# Patient Record
Sex: Male | Born: 1940
Health system: Southern US, Community
[De-identification: ages and names within clinical notes are randomized; demographics above are authoritative.]

## PROBLEM LIST (undated history)

## (undated) DIAGNOSIS — G4709 Other insomnia: Secondary | ICD-10-CM

## (undated) DIAGNOSIS — K219 Gastro-esophageal reflux disease without esophagitis: Secondary | ICD-10-CM

## (undated) DIAGNOSIS — I10 Essential (primary) hypertension: Secondary | ICD-10-CM

## (undated) DIAGNOSIS — I639 Cerebral infarction, unspecified: Secondary | ICD-10-CM

## (undated) DIAGNOSIS — K429 Umbilical hernia without obstruction or gangrene: Secondary | ICD-10-CM

## (undated) DIAGNOSIS — K5900 Constipation, unspecified: Secondary | ICD-10-CM

## (undated) DIAGNOSIS — E119 Type 2 diabetes mellitus without complications: Secondary | ICD-10-CM

## (undated) DIAGNOSIS — E785 Hyperlipidemia, unspecified: Secondary | ICD-10-CM

## (undated) HISTORY — DX: Gastro-esophageal reflux disease without esophagitis: K21.9

## (undated) HISTORY — DX: Hyperlipidemia, unspecified: E78.5

## (undated) HISTORY — DX: Other insomnia: G47.09

## (undated) HISTORY — DX: Essential (primary) hypertension: I10

## (undated) HISTORY — PX: HERNIA REPAIR: SHX51

## (undated) HISTORY — DX: Umbilical hernia without obstruction or gangrene: K42.9

---

## 2010-04-27 ENCOUNTER — Emergency Department (HOSPITAL_COMMUNITY): Admission: EM | Admit: 2010-04-27 | Discharge: 2010-04-27 | Payer: Self-pay | Admitting: Family Medicine

## 2011-10-03 ENCOUNTER — Encounter (INDEPENDENT_AMBULATORY_CARE_PROVIDER_SITE_OTHER): Payer: Self-pay | Admitting: General Surgery

## 2011-10-12 ENCOUNTER — Ambulatory Visit (INDEPENDENT_AMBULATORY_CARE_PROVIDER_SITE_OTHER): Payer: Medicaid Other | Admitting: General Surgery

## 2011-10-12 ENCOUNTER — Encounter (INDEPENDENT_AMBULATORY_CARE_PROVIDER_SITE_OTHER): Payer: Self-pay | Admitting: General Surgery

## 2011-10-12 DIAGNOSIS — K429 Umbilical hernia without obstruction or gangrene: Secondary | ICD-10-CM | POA: Insufficient documentation

## 2011-10-12 DIAGNOSIS — K4090 Unilateral inguinal hernia, without obstruction or gangrene, not specified as recurrent: Secondary | ICD-10-CM | POA: Insufficient documentation

## 2011-10-12 NOTE — Progress Notes (Signed)
Patient ID: Daniel Porter, male   DOB: 07-19-1941, 71 y.o.   MRN: [id number]  Chief Complaint  Patient presents with  . Umbilical Hernia    HPI Daniel Porter is a 71 y.o. male.  [name] is from [place].  He has been here for approximately 3 years. He has a many year history of an umbilical hernia. For a long time it did not bother him. More recently he has some pain in that area. He occasionally has hiccups but no other significant GI history. In the past, he has also had bilateral inguinal hernia repairs. The right side recurred but this does not bother him at all. It has not changed for many years. He is not interested in having anything done for that at this time. He is concerned about his umbilical hernia.  Past Medical History  Diagnosis Date  . GERD (gastroesophageal reflux disease)   . Hyperlipidemia   . Hypertension   . Umbilical hernia   . Initial insomnia     History reviewed. No pertinent past surgical history.  History reviewed. No pertinent family history.  Social History History  Substance Use Topics  . Smoking status: Never Smoker   . Smokeless tobacco: Never Used  . Alcohol Use: No    No Known Allergies  Current Outpatient Prescriptions  Medication Sig Dispense Refill  . esomeprazole (NEXIUM) 40 MG capsule Take 40 mg by mouth daily before breakfast.      . lisinopril (PRINIVIL,ZESTRIL) 20 MG tablet Take 20 mg by mouth daily.      . celecoxib (CELEBREX) 200 MG capsule Take 200 mg by mouth daily.      Marland Kitchen gemfibrozil (LOPID) 600 MG tablet Take 600 mg by mouth 2 (two) times daily before a meal.      . LORazepam (ATIVAN) 2 MG tablet Take 2 mg by mouth daily as needed.        Review of Systems Review of Systems  Constitutional: Negative.   HENT: Negative.   Eyes: Negative.   Respiratory: Negative.   Cardiovascular: Negative.   Gastrointestinal:       See history of present illness  Genitourinary:       See history of present illness    Musculoskeletal: Negative.   Neurological: Negative.     Blood pressure 132/76, pulse 70, temperature 97.8 F (36.6 C), temperature source Temporal, resp. rate 18, height 5\' 8"  (1.727 m), weight 205 lb (92.987 kg).  Physical Exam Physical Exam  HENT:  Head: Normocephalic and atraumatic.  Eyes: EOM are normal. Pupils are equal, round, and reactive to light.  Neck: Normal range of motion.  Cardiovascular: Normal rate, regular rhythm and normal heart sounds.   Pulmonary/Chest: Effort normal and breath sounds normal. No respiratory distress. He has no wheezes.  Abdominal: Soft. He exhibits no distension. There is no tenderness. There is no rebound.       Large Umbilical hernia partially reduces. It is not tender, mostly lying superior to the umbilicus    Data Reviewed Notes from Dr. Melven Porter  Assessment    Symptomatic umbilical hernia, asymptomatic right inguinal hernia    Plan    I have offered repair of his umbilical hernia with mesh. He does not wish to have treatment for his right inguinal hernia this time. Umbilical hernia repair procedure, risks, and benefits were discussed in detail with the patient. His English is pretty good. He also spoke with his primary care physician who helped explain things. I gave him some  literature. I've asked him to review with his daughter whose English is excellent. He will call if he has further questions. He is agreeable with surgery.       Daniel Porter 10/12/2011, 9:50 AM

## 2011-11-08 ENCOUNTER — Encounter (HOSPITAL_COMMUNITY): Payer: Self-pay | Admitting: Pharmacy Technician

## 2011-11-16 ENCOUNTER — Other Ambulatory Visit: Payer: Self-pay

## 2011-11-16 ENCOUNTER — Encounter (HOSPITAL_COMMUNITY)
Admission: RE | Admit: 2011-11-16 | Discharge: 2011-11-16 | Disposition: A | Discharge status: Home / Self Care | Payer: Medicaid Other | Source: Ambulatory Visit | Attending: General Surgery | Admitting: General Surgery

## 2011-11-16 ENCOUNTER — Encounter (HOSPITAL_COMMUNITY): Payer: Self-pay

## 2011-11-16 LAB — CBC
HCT: 42.9 % (ref 39.0–52.0)
Hemoglobin: 15.2 g/dL (ref 13.0–17.0)
MCHC: 35.4 g/dL (ref 30.0–36.0)
RBC: 5.06 MIL/uL (ref 4.22–5.81)
WBC: 7.9 10*3/uL (ref 4.0–10.5)

## 2011-11-16 LAB — BASIC METABOLIC PANEL
BUN: 22 mg/dL (ref 6–23)
CO2: 22 mEq/L (ref 19–32)
Chloride: 101 mEq/L (ref 96–112)
Glucose, Bld: 106 mg/dL — ABNORMAL HIGH (ref 70–99)
Potassium: 4.6 mEq/L (ref 3.5–5.1)

## 2011-11-16 LAB — SURGICAL PCR SCREEN
MRSA, PCR: NEGATIVE
Staphylococcus aureus: NEGATIVE

## 2011-11-16 NOTE — Pre-Procedure Instructions (Signed)
20 Daniel Porter  11/16/2011   Your procedure is scheduled on:  11/22/11  Report to [facility] at 800 AM.  Call this number if you have problems the morning of surgery: [phone]   Remember:   Do not eat food:After Midnight.  May have clear liquids: up to 4 Hours before arrival.  Clear liquids include soda, tea, black coffee, apple or grape juice, broth.  Take these medicines the morning of surgery with A SIP OF WATER: nexium   Do not wear jewelry, make-up or nail polish.  Do not wear lotions, powders, or perfumes. You may wear deodorant.  Do not shave 48 hours prior to surgery.  Do not bring valuables to the hospital.  Contacts, dentures or bridgework may not be worn into surgery.  Leave suitcase in the car. After surgery it may be brought to your room.  For patients admitted to the hospital, checkout time is 11:00 AM the day of discharge.   Patients discharged the day of surgery will not be allowed to drive home.  Name and phone number of your driver: family  Special Instructions: CHG Shower Use Special Wash: 1/2 bottle night before surgery and 1/2 bottle morning of surgery.   Please read over the following fact sheets that you were given: Pain Booklet, MRSA Information and Surgical Site Infection Prevention

## 2011-11-21 MED ORDER — CEFAZOLIN SODIUM-DEXTROSE 2-3 GM-% IV SOLR
2.0000 g | INTRAVENOUS | Status: AC
  Administered 2011-11-22: 2 g via INTRAVENOUS

## 2011-11-22 ENCOUNTER — Encounter (HOSPITAL_COMMUNITY): Payer: Self-pay | Admitting: *Deleted

## 2011-11-22 ENCOUNTER — Ambulatory Visit (HOSPITAL_COMMUNITY)
Admission: RE | Admit: 2011-11-22 | Discharge: 2011-11-22 | Disposition: A | Discharge status: Home / Self Care | Payer: Medicaid Other | Source: Ambulatory Visit | Attending: General Surgery | Admitting: General Surgery

## 2011-11-22 ENCOUNTER — Ambulatory Visit (HOSPITAL_COMMUNITY): Payer: Medicaid Other | Admitting: *Deleted

## 2011-11-22 ENCOUNTER — Encounter (HOSPITAL_COMMUNITY)
Admission: RE | Disposition: A | Discharge status: Home / Self Care | Payer: Self-pay | Source: Ambulatory Visit | Attending: General Surgery

## 2011-11-22 DIAGNOSIS — E785 Hyperlipidemia, unspecified: Secondary | ICD-10-CM | POA: Insufficient documentation

## 2011-11-22 DIAGNOSIS — K429 Umbilical hernia without obstruction or gangrene: Secondary | ICD-10-CM

## 2011-11-22 DIAGNOSIS — Z79899 Other long term (current) drug therapy: Secondary | ICD-10-CM | POA: Insufficient documentation

## 2011-11-22 DIAGNOSIS — K219 Gastro-esophageal reflux disease without esophagitis: Secondary | ICD-10-CM | POA: Insufficient documentation

## 2011-11-22 DIAGNOSIS — Z01810 Encounter for preprocedural cardiovascular examination: Secondary | ICD-10-CM | POA: Insufficient documentation

## 2011-11-22 DIAGNOSIS — I10 Essential (primary) hypertension: Secondary | ICD-10-CM | POA: Insufficient documentation

## 2011-11-22 HISTORY — PX: UMBILICAL HERNIA REPAIR: SHX196

## 2011-11-22 SURGERY — REPAIR, HERNIA, UMBILICAL, ADULT
Anesthesia: General | Site: Abdomen | Wound class: Clean

## 2011-11-22 MED ORDER — CEFAZOLIN SODIUM 1-5 GM-% IV SOLN
INTRAVENOUS | Status: AC
Start: 2011-11-22 — End: 2011-11-22
  Filled 2011-11-22: qty 50

## 2011-11-22 MED ORDER — FENTANYL CITRATE 0.05 MG/ML IJ SOLN
INTRAMUSCULAR | Status: DC | PRN
  Administered 2011-11-22 (×2): 25 ug via INTRAVENOUS
  Administered 2011-11-22 (×2): 50 ug via INTRAVENOUS

## 2011-11-22 MED ORDER — LACTATED RINGERS IV SOLN
INTRAVENOUS | Status: DC | PRN
  Administered 2011-11-22: 09:00:00 via INTRAVENOUS

## 2011-11-22 MED ORDER — ONDANSETRON HCL 4 MG/2ML IJ SOLN: 4.0000 mg | Freq: Once | INTRAMUSCULAR | Status: DC | PRN

## 2011-11-22 MED ORDER — LACTATED RINGERS IV SOLN
INTRAVENOUS | Status: DC
  Administered 2011-11-22: 09:00:00 via INTRAVENOUS

## 2011-11-22 MED ORDER — PROPOFOL 10 MG/ML IV EMUL
INTRAVENOUS | Status: DC | PRN
  Administered 2011-11-22: 170 mg via INTRAVENOUS

## 2011-11-22 MED ORDER — VECURONIUM BROMIDE 10 MG IV SOLR
INTRAVENOUS | Status: DC | PRN
  Administered 2011-11-22: 5 mg via INTRAVENOUS

## 2011-11-22 MED ORDER — NEOSTIGMINE METHYLSULFATE 1 MG/ML IJ SOLN
INTRAMUSCULAR | Status: DC | PRN
  Administered 2011-11-22: 4 mg via INTRAVENOUS

## 2011-11-22 MED ORDER — MIDAZOLAM HCL 5 MG/5ML IJ SOLN
INTRAMUSCULAR | Status: DC | PRN
  Administered 2011-11-22: 2 mg via INTRAVENOUS

## 2011-11-22 MED ORDER — ACETAMINOPHEN 10 MG/ML IV SOLN
INTRAVENOUS | Status: DC | PRN
  Administered 2011-11-22: 1000 mg via INTRAVENOUS

## 2011-11-22 MED ORDER — HYDROMORPHONE HCL PF 1 MG/ML IJ SOLN
0.2500 mg | INTRAMUSCULAR | Status: DC | PRN
  Administered 2011-11-22 (×2): 0.5 mg via INTRAVENOUS

## 2011-11-22 MED ORDER — 0.9 % SODIUM CHLORIDE (POUR BTL) OPTIME
TOPICAL | Status: DC | PRN
  Administered 2011-11-22: 1000 mL

## 2011-11-22 MED ORDER — BUPIVACAINE-EPINEPHRINE 0.5% -1:200000 IJ SOLN
INTRAMUSCULAR | Status: DC | PRN
  Administered 2011-11-22: 20 mL

## 2011-11-22 MED ORDER — PHENYLEPHRINE HCL 10 MG/ML IJ SOLN
INTRAMUSCULAR | Status: DC | PRN
  Administered 2011-11-22: 40 ug via INTRAVENOUS

## 2011-11-22 MED ORDER — GLYCOPYRROLATE 0.2 MG/ML IJ SOLN
INTRAMUSCULAR | Status: DC | PRN
  Administered 2011-11-22: .7 mg via INTRAVENOUS

## 2011-11-22 MED ORDER — ONDANSETRON HCL 4 MG/2ML IJ SOLN
INTRAMUSCULAR | Status: DC | PRN
  Administered 2011-11-22: 4 mg via INTRAVENOUS

## 2011-11-22 MED ORDER — ACETAMINOPHEN 10 MG/ML IV SOLN
INTRAVENOUS | Status: AC
Start: 2011-11-22 — End: 2011-11-22
  Filled 2011-11-22: qty 100

## 2011-11-22 MED ORDER — OXYCODONE-ACETAMINOPHEN 5-325 MG PO TABS: 1.0000 | ORAL_TABLET | Freq: Four times a day (QID) | ORAL | Status: AC | PRN

## 2011-11-22 SURGICAL SUPPLY — 35 items
BLADE SURG ROTATE 9660 (MISCELLANEOUS) IMPLANT
CANISTER SUCTION 2500CC (MISCELLANEOUS) IMPLANT
CHLORAPREP W/TINT 26ML (MISCELLANEOUS) ×2 IMPLANT
CLOTH BEACON ORANGE TIMEOUT ST (SAFETY) ×2 IMPLANT
COVER SURGICAL LIGHT HANDLE (MISCELLANEOUS) ×2 IMPLANT
DERMABOND ADVANCED (GAUZE/BANDAGES/DRESSINGS) ×1
DERMABOND ADVANCED .7 DNX12 (GAUZE/BANDAGES/DRESSINGS) ×1 IMPLANT
DRAPE PED LAPAROTOMY (DRAPES) ×2 IMPLANT
DRAPE UTILITY 15X26 W/TAPE STR (DRAPE) ×4 IMPLANT
ELECT REM PT RETURN 9FT ADLT (ELECTROSURGICAL) ×2
ELECTRODE REM PT RTRN 9FT ADLT (ELECTROSURGICAL) ×1 IMPLANT
GLOVE BIO SURGEON STRL SZ7.5 (GLOVE) ×2 IMPLANT
GLOVE BIO SURGEON STRL SZ8 (GLOVE) ×2 IMPLANT
GLOVE BIOGEL PI IND STRL 8 (GLOVE) ×1 IMPLANT
GLOVE BIOGEL PI INDICATOR 8 (GLOVE) ×1
GOWN PREVENTION PLUS XLARGE (GOWN DISPOSABLE) ×2 IMPLANT
GOWN STRL NON-REIN LRG LVL3 (GOWN DISPOSABLE) ×2 IMPLANT
KIT BASIN OR (CUSTOM PROCEDURE TRAY) ×2 IMPLANT
KIT ROOM TURNOVER OR (KITS) ×2 IMPLANT
MESH VENTRALEX ST 2.5 CRC MED (Mesh General) ×2 IMPLANT
NEEDLE HYPO 25GX1X1/2 BEV (NEEDLE) ×2 IMPLANT
NS IRRIG 1000ML POUR BTL (IV SOLUTION) ×2 IMPLANT
PACK GENERAL/GYN (CUSTOM PROCEDURE TRAY) ×2 IMPLANT
PAD ARMBOARD 7.5X6 YLW CONV (MISCELLANEOUS) ×2 IMPLANT
SUT MNCRL AB 4-0 PS2 18 (SUTURE) ×2 IMPLANT
SUT NOVA NAB DX-16 0-1 5-0 T12 (SUTURE) ×4 IMPLANT
SUT PROLENE 0 CT 1 30 (SUTURE) IMPLANT
SUT VIC AB 2-0 CT1 27 (SUTURE) ×1
SUT VIC AB 2-0 CT1 TAPERPNT 27 (SUTURE) ×1 IMPLANT
SUT VIC AB 3-0 SH 27 (SUTURE) ×1
SUT VIC AB 3-0 SH 27XBRD (SUTURE) ×1 IMPLANT
SYR CONTROL 10ML LL (SYRINGE) ×2 IMPLANT
TOWEL OR 17X24 6PK STRL BLUE (TOWEL DISPOSABLE) ×2 IMPLANT
TOWEL OR 17X26 10 PK STRL BLUE (TOWEL DISPOSABLE) ×2 IMPLANT
TOWEL OR NON WOVEN STRL DISP B (DISPOSABLE) ×2 IMPLANT

## 2011-11-22 NOTE — Op Note (Signed)
11/22/2011  10:53 AM  PATIENT:  Daniel Porter  71 y.o. male  PRE-OPERATIVE DIAGNOSIS:  umbilical hernia  POST-OPERATIVE DIAGNOSIS:  umbilical hernia  PROCEDURE:  Procedure(s): HERNIA REPAIR UMBILICAL ADULT INSERTION OF MESH  SURGEON:  Surgeon(s): Liz Malady, MD  PHYSICIAN ASSISTANT:   ASSISTANTS: none   ANESTHESIA:   general  EBL:  Total I/O In: 500 [I.V.:500] Out: -   BLOOD ADMINISTERED:none  DRAINS: none   SPECIMEN:  No Specimen  DISPOSITION OF SPECIMEN:  N/A  COUNTS:  YES  DICTATION: .Dragon Dictation patient presents for repair of supra-umbilical hernia. He was examined and identified in the preop holding area. He received intravenous antibiotics. Informed consent was obtained. He was brought to the operating room and general endotracheal anesthesia was administered by the anesthesia staff. His abdomen was prepped and draped in sterile fashion. We did time out procedure. The hernia was not completely reducible. 22 its large size, a vertical incision was made from the umbilicus cephalad. Subcutaneous tissues were dissected around the hernia sac. It contained omentum. The fascia was cleared off circumferentially but the hernia was quite lobulated to not be reduced yet. The sac was completely dissected off of the hernia contents which was omentum. The sac was excised and discarded. The omentum was checked and had good hemostasis. It was returned to the abdomen without difficulty. The fascial defect was about of 2 x 3 cm. A 6.5 cm circular hernia patch was selected. This was inserted and came up nicely against the abdominal wall. It was secured circumferentially with interrupted #1 Novafil sutures. The 2 leaflets were trimmed away during this process. Local anesthetic was injected into the skin and subcutaneous tissues. Area was copiously irrigated. Excellent hemostasis was obtained. Subcutaneous tissues were approximated with running 2-0 Vicryl. Area was irrigated and  the skin was closed with running 4-0 Monocryl subcuticular stitch followed by Dermabond. All counts were correct. Patient tolerated procedure well without apparent complication and was taken recovery room in stable condition.  PATIENT DISPOSITION:  PACU - hemodynamically stable.   Delay start of Pharmacological VTE agent (>24hrs) due to surgical blood loss or risk of bleeding:  no  Violeta Gelinas, MD, MPH, FACS Pager: [phone]  4/9/201310:53 AM

## 2011-11-22 NOTE — Anesthesia Preprocedure Evaluation (Signed)
Anesthesia Evaluation  Patient identified by MRN, date of birth, ID band Patient awake    Reviewed: Allergy & Precautions, H&P , NPO status , Patient's Chart, lab work & pertinent test results  Airway Mallampati: III      Dental  (+) Edentulous Upper and Edentulous Lower   Pulmonary  breath sounds clear to auscultation        Cardiovascular Rhythm:Regular Rate:Normal     Neuro/Psych    GI/Hepatic   Endo/Other    Renal/GU      Musculoskeletal   Abdominal   Peds  Hematology   Anesthesia Other Findings   Reproductive/Obstetrics                           Anesthesia Physical Anesthesia Plan  ASA: III  Anesthesia Plan: General   Post-op Pain Management:    Induction: Intravenous  Airway Management Planned: Oral ETT  Additional Equipment:   Intra-op Plan:   Post-operative Plan: Extubation in OR  Informed Consent: I have reviewed the patients History and Physical, chart, labs and discussed the procedure including the risks, benefits and alternatives for the proposed anesthesia with the patient or authorized representative who has indicated his/her understanding and acceptance.     Plan Discussed with:   Anesthesia Plan Comments: (htn GERD Ventral Hernia Obesity  Plan GA with ETT  Kipp Brood, MD)        Anesthesia Quick Evaluation

## 2011-11-22 NOTE — H&P (Signed)
Daniel Porter is a 71 y.o. male.  [name] is from [place]. He has been here for approximately 3 years. He has a many year history of an umbilical hernia. For a long time it did not bother him. More recently he has some pain in that area. He occasionally has hiccups but no other significant GI history. In the past, he has also had bilateral inguinal hernia repairs. The right side recurred but this does not bother him at all. It has not changed for many years. He is not interested in having anything done for that at this time. He is concerned about his umbilical hernia.  Past Medical History   Diagnosis  Date   .  GERD (gastroesophageal reflux disease)    .  Hyperlipidemia    .  Hypertension    .  Umbilical hernia    .  Initial insomnia     History reviewed. No pertinent past surgical history.  History reviewed. No pertinent family history.  Social History  History   Substance Use Topics   .  Smoking status:  Never Smoker   .  Smokeless tobacco:  Never Used   .  Alcohol Use:  No    No Known Allergies  Current Outpatient Prescriptions   Medication  Sig  Dispense  Refill   .  esomeprazole (NEXIUM) 40 MG capsule  Take 40 mg by mouth daily before breakfast.     .  lisinopril (PRINIVIL,ZESTRIL) 20 MG tablet  Take 20 mg by mouth daily.     .  celecoxib (CELEBREX) 200 MG capsule  Take 200 mg by mouth daily.     Marland Kitchen  gemfibrozil (LOPID) 600 MG tablet  Take 600 mg by mouth 2 (two) times daily before a meal.     .  LORazepam (ATIVAN) 2 MG tablet  Take 2 mg by mouth daily as needed.      Review of Systems  Review of Systems  Constitutional: Negative.  HENT: Negative.  Eyes: Negative.  Respiratory: Negative.  Cardiovascular: Negative.  Gastrointestinal:  See history of present illness  Genitourinary:  See history of present illness  Musculoskeletal: Negative.  Neurological: Negative.   Blood pressure 132/76, pulse 70, temperature 97.8 F (36.6 C), temperature source Temporal, resp.  rate 18, height 5\' 8"  (1.727 m), weight 205 lb (92.987 kg).  Physical Exam  Physical Exam  HENT:  Head: Normocephalic and atraumatic.  Eyes: EOM are normal. Pupils are equal, round, and reactive to light.  Neck: Normal range of motion.  Cardiovascular: Normal rate, regular rhythm and normal heart sounds.  Pulmonary/Chest: Effort normal and breath sounds normal. No respiratory distress. He has no wheezes.  Abdominal: Soft. He exhibits no distension. There is no tenderness. There is no rebound.  Large Umbilical hernia partially reduces. It is not tender, mostly lying superior to the umbilicus   Data Reviewed  Notes from Dr. Melven Sartorius  Assessment   Symptomatic umbilical hernia, asymptomatic right inguinal hernia   Plan   I have offered repair of his umbilical hernia with mesh. He does not wish to have treatment for his right inguinal hernia this time. Umbilical hernia repair procedure, risks, and benefits were discussed in detail with the patient. His English is pretty good. He also spoke with his primary care physician who helped explain things. I gave him some literature. I've asked him to review with his daughter whose English is excellent. He will call if he has further questions. He is agreeable with surgery.  Deitra Craine E  

## 2011-11-22 NOTE — Anesthesia Postprocedure Evaluation (Signed)
  Anesthesia Post-op Note  Patient: Daniel Porter  Procedure(s) Performed: Procedure(s) (LRB): HERNIA REPAIR UMBILICAL ADULT (N/A) INSERTION OF MESH (N/A)  Patient Location: PACU  Anesthesia Type: General  Level of Consciousness: awake and alert   Airway and Oxygen Therapy: Patient Spontanous Breathing and Patient connected to nasal cannula oxygen  Post-op Pain: mild  Post-op Assessment: Post-op Vital signs reviewed and Patient's Cardiovascular Status Stable  Post-op Vital Signs: stable  Complications: No apparent anesthesia complications

## 2011-11-22 NOTE — Preoperative (Signed)
Beta Blockers   Reason not to administer Beta Blockers:Not Applicable 

## 2011-11-22 NOTE — Transfer of Care (Signed)
Immediate Anesthesia Transfer of Care Note  Patient: Daniel Porter  Procedure(s) Performed: Procedure(s) (LRB): HERNIA REPAIR UMBILICAL ADULT (N/A) INSERTION OF MESH (N/A)  Patient Location: PACU  Anesthesia Type: General  Level of Consciousness: patient cooperative and responds to stimulation  Airway & Oxygen Therapy: Patient Spontanous Breathing and Patient connected to nasal cannula oxygen  Post-op Assessment: Report given to PACU RN, Post -op Vital signs reviewed and stable and Patient moving all extremities X 4  Post vital signs: Reviewed  Complications: No apparent anesthesia complications

## 2011-11-22 NOTE — Interval H&P Note (Signed)
History and Physical Interval Note:  11/22/2011 9:49 AM  Daniel Porter  has presented today for surgery, with the diagnosis of umbilical hernia  The various methods of treatment have been discussed with the patient and family. After consideration of risks, benefits and other options for treatment, the patient has consented to  Procedure(s) (LRB): HERNIA REPAIR UMBILICAL ADULT (N/A) INSERTION OF MESH (N/A) as a surgical intervention .  The patients' history has been reviewed, patient re-examined, no change in status, stable for surgery.  I have reviewed the patients' chart and labs.  Questions were answered to the patient's satisfaction.     [name] E

## 2011-11-23 ENCOUNTER — Encounter (HOSPITAL_COMMUNITY): Payer: Self-pay | Admitting: General Surgery

## 2011-11-23 NOTE — Progress Notes (Signed)
Pt speaks very poor [place]... Did not understand instructions ... But said daughter would be there  And could  Help with post op care at home ...  I gave him the number for Dr Janee Morn.Marland Kitchenand encouraged him to call or have daughter call the office .

## 2011-11-29 ENCOUNTER — Telehealth (INDEPENDENT_AMBULATORY_CARE_PROVIDER_SITE_OTHER): Payer: Self-pay | Admitting: General Surgery

## 2011-11-29 NOTE — Telephone Encounter (Signed)
Pt of Dr. Janee Morn calling for post op appt.  Needs to be worked in if possible.

## 2011-11-29 NOTE — Telephone Encounter (Signed)
I called the pt to make an appt and he has some concerns so I scheduled him for tomorrow.  There was an opening.

## 2011-11-30 ENCOUNTER — Encounter (INDEPENDENT_AMBULATORY_CARE_PROVIDER_SITE_OTHER): Payer: Self-pay | Admitting: General Surgery

## 2011-11-30 ENCOUNTER — Ambulatory Visit (INDEPENDENT_AMBULATORY_CARE_PROVIDER_SITE_OTHER): Payer: Medicaid Other | Admitting: General Surgery

## 2011-11-30 VITALS — BP 128/84 | HR 64 | Temp 97.8°F | Resp 18 | Ht 70.0 in | Wt 203.2 lb

## 2011-11-30 DIAGNOSIS — Z09 Encounter for follow-up examination after completed treatment for conditions other than malignant neoplasm: Secondary | ICD-10-CM | POA: Insufficient documentation

## 2011-11-30 NOTE — Progress Notes (Signed)
Subjective:     Patient ID: Daniel Porter, male   DOB: 1941-04-16, 71 y.o.   MRN: [id number]  HPI Patient underwent repair of supraumbilical hernia with mesh on April 9. He has been doing pretty well. He is only taking one pain pill a day. He still has some swelling in the area. He had questions about if he is able to shower and wash it with soap.  Review of Systems     Objective:   Physical Exam Abdomen is soft and nontender. His incision is intact. There is a tiny amount of drainage on his shirt.    There is some swelling in the area/small seroma but no evidence of cellulitis or infection. Assessment:     Coming along well after repair of supraumbilical hernia.    Plan:     Avoid heavy lifting and return in one month for recheck.

## 2011-11-30 NOTE — Patient Instructions (Signed)
May shower and wash with soap and water

## 2011-12-28 ENCOUNTER — Encounter (INDEPENDENT_AMBULATORY_CARE_PROVIDER_SITE_OTHER): Payer: Self-pay | Admitting: General Surgery

## 2011-12-28 ENCOUNTER — Ambulatory Visit (INDEPENDENT_AMBULATORY_CARE_PROVIDER_SITE_OTHER): Payer: Medicaid Other | Admitting: General Surgery

## 2011-12-28 VITALS — BP 120/80 | HR 66 | Resp 18 | Ht 70.0 in | Wt 203.0 lb

## 2011-12-28 DIAGNOSIS — Z09 Encounter for follow-up examination after completed treatment for conditions other than malignant neoplasm: Secondary | ICD-10-CM

## 2011-12-28 NOTE — Progress Notes (Signed)
Subjective:     Patient ID: Daniel Porter, male   DOB: 1940-09-30, 71 y.o.   MRN: [id number]  HPI  Patient presents for followup status post repair of supraumbilical hernia. He is doing very well. He is no longer having any pain. He is doing walking but no other strenuous exercise.He brought a form from [employer] regarding a Lyrica prescription. I have not prescribe that for him. Review of Systems     Objective:   Physical Exam Abdomen is soft and nontender. Incision is healing well. Hernia repair is intact. No evidence of infection.    Assessment:     Doing very well status post repair of supraumbilical hernia with mesh    Plan:     Avoid heavy lifting for a total of 6 weeks after surgery. I advised him to get his medication form to his primary care physician, Dr. Melven Sartorius. Return PRN

## 2012-09-18 ENCOUNTER — Telehealth (INDEPENDENT_AMBULATORY_CARE_PROVIDER_SITE_OTHER): Payer: Self-pay | Admitting: *Deleted

## 2012-09-18 NOTE — Telephone Encounter (Signed)
Patient called to request a follow up appt due to pain at the umbilical hernia repair site.  There was a language barrier but patient was able to repeat back appt date that was established.

## 2012-10-17 ENCOUNTER — Encounter (INDEPENDENT_AMBULATORY_CARE_PROVIDER_SITE_OTHER): Payer: Self-pay | Admitting: General Surgery

## 2012-10-17 ENCOUNTER — Ambulatory Visit (INDEPENDENT_AMBULATORY_CARE_PROVIDER_SITE_OTHER): Payer: Medicaid Other | Admitting: General Surgery

## 2012-10-17 VITALS — BP 120/82 | HR 60 | Temp 97.1°F | Resp 18 | Wt 210.0 lb

## 2012-10-17 DIAGNOSIS — K429 Umbilical hernia without obstruction or gangrene: Secondary | ICD-10-CM

## 2012-10-17 NOTE — Progress Notes (Signed)
Subjective:     Patient ID: Daniel Porter, male   DOB: August 08, 1941, 72 y.o.   MRN: [id number]  HPI Patient is well-known to me status post umbilical hernia repair last year. He has lost weight recently by changing his diet. He is also walking for exercise. He noticed a swelling on the left side of his umbilicus. He has had no bowel changes. No pain in the area. He also has some questions regarding his arthritis medication. He has been taking aspirin with caffeine instead of Celebrex due to the cost.  Review of Systems     Objective:   Physical Exam  Constitutional: He appears well-developed. No distress.  HENT:  Head: Normocephalic.  Eyes: Pupils are equal, round, and reactive to light.  Neck: Normal range of motion. No tracheal deviation present.  Cardiovascular: Normal rate and normal heart sounds.   Pulmonary/Chest: Effort normal and breath sounds normal. No stridor.  Abdominal: Soft. He exhibits no distension. There is no tenderness. There is no rebound and no guarding.    Hernia is present lateral to his old scar, easily reduces, no pain, fascial defect feels approximately 3 cm  Musculoskeletal: Normal range of motion.       Assessment:     Recurrent umbilical hernia    Plan:     I have offered laparoscopic repair with mesh. We spoke in detail regarding the procedure. He has some financial issues and is unable to proceed at this time. He is agreed to come back and see me in about 3 months. In the interim I advised him to let me know if he has any changes, pain, signs and symptoms of incarceration. He is agreed. I also discussed with him his arthritis medication. While aspirin as a suitable choice, being an NSAID, I recommend he discuss the fact that he stopped his Celebrex with his primary care physician.

## 2012-10-17 NOTE — Patient Instructions (Addendum)
Call if your symptoms worsen. I will see you again in 3 months.

## 2013-01-09 ENCOUNTER — Ambulatory Visit (INDEPENDENT_AMBULATORY_CARE_PROVIDER_SITE_OTHER): Payer: Medicaid Other | Admitting: General Surgery

## 2013-01-16 ENCOUNTER — Encounter (INDEPENDENT_AMBULATORY_CARE_PROVIDER_SITE_OTHER): Payer: Self-pay | Admitting: General Surgery

## 2013-09-13 ENCOUNTER — Ambulatory Visit: Payer: Medicaid Other | Admitting: *Deleted

## 2014-07-15 ENCOUNTER — Encounter (HOSPITAL_COMMUNITY): Payer: Self-pay | Admitting: *Deleted

## 2014-07-15 ENCOUNTER — Emergency Department (HOSPITAL_COMMUNITY)
Admission: EM | Admit: 2014-07-15 | Discharge: 2014-07-16 | Discharge status: Against Medical Advice | Payer: Medicaid Other | Attending: Emergency Medicine | Admitting: Emergency Medicine

## 2014-07-15 DIAGNOSIS — K219 Gastro-esophageal reflux disease without esophagitis: Secondary | ICD-10-CM | POA: Insufficient documentation

## 2014-07-15 DIAGNOSIS — M25561 Pain in right knee: Secondary | ICD-10-CM | POA: Insufficient documentation

## 2014-07-15 DIAGNOSIS — M25562 Pain in left knee: Secondary | ICD-10-CM | POA: Insufficient documentation

## 2014-07-15 DIAGNOSIS — E785 Hyperlipidemia, unspecified: Secondary | ICD-10-CM | POA: Diagnosis not present

## 2014-07-15 DIAGNOSIS — I10 Essential (primary) hypertension: Secondary | ICD-10-CM | POA: Diagnosis not present

## 2014-07-15 DIAGNOSIS — R109 Unspecified abdominal pain: Secondary | ICD-10-CM | POA: Diagnosis not present

## 2014-07-15 DIAGNOSIS — R531 Weakness: Secondary | ICD-10-CM | POA: Diagnosis present

## 2014-07-15 DIAGNOSIS — G8929 Other chronic pain: Secondary | ICD-10-CM | POA: Diagnosis not present

## 2014-07-15 DIAGNOSIS — Z8719 Personal history of other diseases of the digestive system: Secondary | ICD-10-CM | POA: Insufficient documentation

## 2014-07-15 LAB — CBC WITH DIFFERENTIAL/PLATELET
BASOS ABS: 0 10*3/uL (ref 0.0–0.1)
BASOS PCT: 0 % (ref 0–1)
EOS PCT: 5 % (ref 0–5)
Eosinophils Absolute: 0.4 10*3/uL (ref 0.0–0.7)
HEMATOCRIT: 46.9 % (ref 39.0–52.0)
Hemoglobin: 16.5 g/dL (ref 13.0–17.0)
LYMPHS PCT: 25 % (ref 12–46)
Lymphs Abs: 2 10*3/uL (ref 0.7–4.0)
MCH: 30.7 pg (ref 26.0–34.0)
MCHC: 35.2 g/dL (ref 30.0–36.0)
MCV: 87.2 fL (ref 78.0–100.0)
MONO ABS: 0.6 10*3/uL (ref 0.1–1.0)
Monocytes Relative: 8 % (ref 3–12)
NEUTROS ABS: 4.8 10*3/uL (ref 1.7–7.7)
Neutrophils Relative %: 62 % (ref 43–77)
PLATELETS: 184 10*3/uL (ref 150–400)
RBC: 5.38 MIL/uL (ref 4.22–5.81)
RDW: 13.6 % (ref 11.5–15.5)
WBC: 7.9 10*3/uL (ref 4.0–10.5)

## 2014-07-15 LAB — COMPREHENSIVE METABOLIC PANEL
ALBUMIN: 4 g/dL (ref 3.5–5.2)
ALT: 18 U/L (ref 0–53)
AST: 20 U/L (ref 0–37)
Alkaline Phosphatase: 112 U/L (ref 39–117)
Anion gap: 14 (ref 5–15)
BUN: 22 mg/dL (ref 6–23)
CALCIUM: 10.9 mg/dL — AB (ref 8.4–10.5)
CHLORIDE: 100 meq/L (ref 96–112)
CO2: 23 mEq/L (ref 19–32)
CREATININE: 1.45 mg/dL — AB (ref 0.50–1.35)
GFR calc Af Amer: 53 mL/min — ABNORMAL LOW (ref >90)
GFR calc non Af Amer: 46 mL/min — ABNORMAL LOW (ref >90)
Glucose, Bld: 108 mg/dL — ABNORMAL HIGH (ref 70–99)
Potassium: 4.7 mEq/L (ref 3.7–5.3)
SODIUM: 137 meq/L (ref 137–147)
Total Bilirubin: 0.3 mg/dL (ref 0.3–1.2)
Total Protein: 8 g/dL (ref 6.0–8.3)

## 2014-07-15 LAB — I-STAT TROPONIN, ED: TROPONIN I, POC: 0 ng/mL (ref 0.00–0.08)

## 2014-07-15 LAB — LIPASE, BLOOD: LIPASE: 80 U/L — AB (ref 11–59)

## 2014-07-15 NOTE — ED Notes (Signed)
Feeling  Weak, want to fall down. He was dizzy and scared to fall. Son at bedside for interpretation. Speaks arabic/english. Also, here to have md sign off for housing. "disability."

## 2014-07-15 NOTE — ED Provider Notes (Signed)
CSN: [id number]     Arrival date & time 07/15/14  1828 History   First MD Initiated Contact with Patient 07/15/14 2240     Chief Complaint  Patient presents with  . Weakness  . Abdominal Pain     (Consider location/radiation/quality/duration/timing/severity/associated sxs/prior Treatment) HPI Comments: Patient reports he is having trouble walking up and down the stairs in his home due to knee pain and would like a note to present to the "ousing authority" stating that he needs a 1 story home . Also reports that he has chronic abdominal pain at his hernia repair site.  Denies N/V/D, fevers SOB, CP   Patient is a 73 y.o. male presenting with weakness and abdominal pain. The history is provided by the patient.  Weakness This is a new problem. The current episode started yesterday. The problem has been unchanged. Associated symptoms include abdominal pain, arthralgias and weakness. Pertinent negatives include no chest pain, coughing, diaphoresis, fatigue, fever, headaches, joint swelling, myalgias, nausea, neck pain, numbness, rash, sore throat, urinary symptoms, vertigo, visual change or vomiting. The symptoms are aggravated by walking. He has tried nothing for the symptoms. The treatment provided no relief.  Abdominal Pain Associated symptoms: no chest pain, no constipation, no cough, no diarrhea, no dysuria, no fatigue, no fever, no nausea, no sore throat and no vomiting     Past Medical History  Diagnosis Date  . GERD (gastroesophageal reflux disease)   . Hyperlipidemia   . Hypertension   . Umbilical hernia   . Initial insomnia    Past Surgical History  Procedure Laterality Date  . Hernia repair      2  . Umbilical hernia repair  11/22/2011    Procedure: HERNIA REPAIR UMBILICAL ADULT;  Surgeon: Liz MaladyBurke E Thompson, MD;  [facility];  Service: General;  Laterality: N/A;   No family history on file. History  Substance Use Topics  . Smoking status: Never Smoker   . Smokeless  tobacco: Never Used  . Alcohol Use: No    Review of Systems  Constitutional: Negative for fever, diaphoresis and fatigue.  HENT: Negative for sore throat.   Respiratory: Negative for cough.   Cardiovascular: Negative for chest pain.  Gastrointestinal: Positive for abdominal pain. Negative for nausea, vomiting, diarrhea and constipation.  Genitourinary: Negative for dysuria.  Musculoskeletal: Positive for arthralgias. Negative for myalgias, joint swelling and neck pain.  Skin: Negative for rash.  Neurological: Positive for weakness. Negative for vertigo, numbness and headaches.  All other systems reviewed and are negative.     Allergies  Review of patient's allergies indicates no known allergies.  Home Medications   Prior to Admission medications   Medication Sig Start Date End Date Taking? Authorizing Provider  esomeprazole (NEXIUM) 40 MG capsule Take 40 mg by mouth daily as needed. For reflux    Historical Provider, MD  gemfibrozil (LOPID) 600 MG tablet Take 600-900 mg by mouth 2 (two) times daily before a meal. 600mg  in AM and 300mg  in PM    Historical Provider, MD  lisinopril (PRINIVIL,ZESTRIL) 20 MG tablet Take 20 mg by mouth daily.    Historical Provider, MD   BP 132/85 mmHg  Pulse 70  Temp(Src) 98 F (36.7 C) (Oral)  Resp 18  SpO2 97% Physical Exam  Constitutional: He is oriented to person, place, and time. He appears well-developed.  HENT:  Head: Normocephalic and atraumatic.  Eyes: Pupils are equal, round, and reactive to light.  Neck: Normal range of motion.  Cardiovascular: Normal  rate and regular rhythm.   Pulmonary/Chest: Effort normal and breath sounds normal.  Abdominal: Soft. Bowel sounds are normal. He exhibits no distension. There is no tenderness.  Healed surgical scar ventral hernia repair  Musculoskeletal: Normal range of motion. He exhibits no edema or tenderness.  Neurological: He is oriented to person, place, and time.  Skin: Skin is warm. No  rash noted.    ED Course  Procedures (including critical care time) Labs Review Labs Reviewed  COMPREHENSIVE METABOLIC PANEL - Abnormal; Notable for the following:    Glucose, Bld 108 (*)    Creatinine, Ser 1.45 (*)    Calcium 10.9 (*)    GFR calc non Af Amer 46 (*)    GFR calc Af Amer 53 (*)    All other components within normal limits  LIPASE, BLOOD - Abnormal; Notable for the following:    Lipase 80 (*)    All other components within normal limits  CBC WITH DIFFERENTIAL  URINALYSIS, ROUTINE W REFLEX MICROSCOPIC  I-STAT TROPOININ, ED    Imaging Review No results found.   EKG Interpretation None    Patient would not provide urine sample but again requested a not for housing authority to obtain a one story home   MDM   Final diagnoses:  Arthralgia of both knees         Arman FilterGail K Sharod Petsch, NP 07/16/14 0008  Gerhard Munchobert Lockwood, MD 07/16/14 [phone]

## 2014-07-15 NOTE — ED Notes (Signed)
Gail, NP at bedside. 

## 2014-07-15 NOTE — ED Notes (Signed)
Poor historian - needs hx. Updated.

## 2014-07-16 NOTE — ED Notes (Addendum)
Attempted to assess patient; Pt very agitated, upset, and yelling at nurse. Attempted to calm patient down and explain delay. Pt requesting to leave. [name], NP aware of situation. Pt ambulatory out of department with steady gait.

## 2014-09-17 ENCOUNTER — Emergency Department (HOSPITAL_COMMUNITY)
Admission: EM | Admit: 2014-09-17 | Discharge: 2014-09-17 | Disposition: A | Discharge status: Home / Self Care | Payer: Medicaid Other | Attending: Emergency Medicine | Admitting: Emergency Medicine

## 2014-09-17 ENCOUNTER — Encounter (HOSPITAL_COMMUNITY): Payer: Self-pay | Admitting: *Deleted

## 2014-09-17 DIAGNOSIS — N390 Urinary tract infection, site not specified: Secondary | ICD-10-CM | POA: Diagnosis not present

## 2014-09-17 DIAGNOSIS — I10 Essential (primary) hypertension: Secondary | ICD-10-CM | POA: Insufficient documentation

## 2014-09-17 DIAGNOSIS — Z8669 Personal history of other diseases of the nervous system and sense organs: Secondary | ICD-10-CM | POA: Insufficient documentation

## 2014-09-17 DIAGNOSIS — E785 Hyperlipidemia, unspecified: Secondary | ICD-10-CM | POA: Diagnosis not present

## 2014-09-17 DIAGNOSIS — K219 Gastro-esophageal reflux disease without esophagitis: Secondary | ICD-10-CM | POA: Diagnosis not present

## 2014-09-17 DIAGNOSIS — Z79899 Other long term (current) drug therapy: Secondary | ICD-10-CM | POA: Insufficient documentation

## 2014-09-17 DIAGNOSIS — R300 Dysuria: Secondary | ICD-10-CM | POA: Diagnosis present

## 2014-09-17 LAB — COMPREHENSIVE METABOLIC PANEL
ALT: 17 U/L (ref 0–53)
AST: 20 U/L (ref 0–37)
Albumin: 4 g/dL (ref 3.5–5.2)
Alkaline Phosphatase: 123 U/L — ABNORMAL HIGH (ref 39–117)
Anion gap: 6 (ref 5–15)
BILIRUBIN TOTAL: 0.5 mg/dL (ref 0.3–1.2)
BUN: 24 mg/dL — AB (ref 6–23)
CO2: 23 mmol/L (ref 19–32)
Calcium: 10.5 mg/dL (ref 8.4–10.5)
Chloride: 106 mmol/L (ref 96–112)
Creatinine, Ser: 1.57 mg/dL — ABNORMAL HIGH (ref 0.50–1.35)
GFR calc non Af Amer: 42 mL/min — ABNORMAL LOW (ref >90)
GFR, EST AFRICAN AMERICAN: 48 mL/min — AB (ref >90)
Glucose, Bld: 144 mg/dL — ABNORMAL HIGH (ref 70–99)
Potassium: 4.2 mmol/L (ref 3.5–5.1)
SODIUM: 135 mmol/L (ref 135–145)
TOTAL PROTEIN: 7.7 g/dL (ref 6.0–8.3)

## 2014-09-17 LAB — CBC WITH DIFFERENTIAL/PLATELET
BASOS ABS: 0 10*3/uL (ref 0.0–0.1)
Basophils Relative: 0 % (ref 0–1)
EOS PCT: 4 % (ref 0–5)
Eosinophils Absolute: 0.3 10*3/uL (ref 0.0–0.7)
HEMATOCRIT: 42.6 % (ref 39.0–52.0)
HEMOGLOBIN: 15 g/dL (ref 13.0–17.0)
LYMPHS ABS: 1.7 10*3/uL (ref 0.7–4.0)
Lymphocytes Relative: 24 % (ref 12–46)
MCH: 29.3 pg (ref 26.0–34.0)
MCHC: 35.2 g/dL (ref 30.0–36.0)
MCV: 83.2 fL (ref 78.0–100.0)
MONO ABS: 0.9 10*3/uL (ref 0.1–1.0)
MONOS PCT: 12 % (ref 3–12)
Neutro Abs: 4.3 10*3/uL (ref 1.7–7.7)
Neutrophils Relative %: 60 % (ref 43–77)
Platelets: 203 10*3/uL (ref 150–400)
RBC: 5.12 MIL/uL (ref 4.22–5.81)
RDW: 13.2 % (ref 11.5–15.5)
WBC: 7.2 10*3/uL (ref 4.0–10.5)

## 2014-09-17 LAB — URINALYSIS, ROUTINE W REFLEX MICROSCOPIC
BILIRUBIN URINE: NEGATIVE
GLUCOSE, UA: NEGATIVE mg/dL
KETONES UR: NEGATIVE mg/dL
NITRITE: NEGATIVE
PH: 5.5 (ref 5.0–8.0)
Protein, ur: 100 mg/dL — AB
SPECIFIC GRAVITY, URINE: 1.017 (ref 1.005–1.030)
Urobilinogen, UA: 0.2 mg/dL (ref 0.0–1.0)

## 2014-09-17 LAB — URINE MICROSCOPIC-ADD ON

## 2014-09-17 MED ORDER — CIPROFLOXACIN HCL 500 MG PO TABS: 500.0000 mg | ORAL_TABLET | Freq: Two times a day (BID) | ORAL | Status: DC

## 2014-09-17 MED ORDER — CEFTRIAXONE SODIUM 1 G IJ SOLR
1.0000 g | Freq: Once | INTRAMUSCULAR | Status: AC
  Administered 2014-09-17: 1 g via INTRAMUSCULAR
  Filled 2014-09-17: qty 10

## 2014-09-17 NOTE — ED Notes (Signed)
The pt is c/o painful urination for one week.  He speaks arabic son is speaking for him

## 2014-09-17 NOTE — Discharge Instructions (Signed)

## 2014-09-18 LAB — URINE CULTURE
COLONY COUNT: NO GROWTH
Culture: NO GROWTH

## 2014-09-18 MED ORDER — CIPROFLOXACIN HCL 500 MG PO TABS: 500.0000 mg | ORAL_TABLET | Freq: Two times a day (BID) | ORAL | Status: DC

## 2014-09-19 NOTE — ED Provider Notes (Signed)
CSN: [id number]     Arrival date & time 09/17/14  1745 History   First MD Initiated Contact with Patient 09/17/14 1959     Chief Complaint  Patient presents with  . Dysuria     (Consider location/radiation/quality/duration/timing/severity/associated sxs/prior Treatment) Patient is a 74 y.o. male presenting with dysuria. The history is provided by the patient. No language interpreter was used.  Dysuria This is a new problem. The current episode started in the past 7 days. The problem occurs constantly. The problem has been gradually worsening. Pertinent negatives include no abdominal pain, anorexia, arthralgias, change in bowel habit, chest pain, chills, congestion, coughing, diaphoresis, fatigue, fever, headaches, joint swelling, myalgias, nausea, neck pain, numbness, rash, sore throat, swollen glands, urinary symptoms, vertigo, visual change or vomiting. Exacerbated by: urinating. He has tried nothing for the symptoms.    Past Medical History  Diagnosis Date  . GERD (gastroesophageal reflux disease)   . Hyperlipidemia   . Hypertension   . Umbilical hernia   . Initial insomnia    Past Surgical History  Procedure Laterality Date  . Hernia repair      2  . Umbilical hernia repair  11/22/2011    Procedure: HERNIA REPAIR UMBILICAL ADULT;  Surgeon: Liz Malady, MD;  Location: [facility] OR;  Service: General;  Laterality: N/A;   No family history on file. History  Substance Use Topics  . Smoking status: Never Smoker   . Smokeless tobacco: Never Used  . Alcohol Use: No    Review of Systems  Constitutional: Negative for fever, chills, diaphoresis and fatigue.  HENT: Negative for congestion and sore throat.   Respiratory: Negative for cough and shortness of breath.   Cardiovascular: Negative for chest pain and palpitations.  Gastrointestinal: Negative for nausea, vomiting, abdominal pain, diarrhea, constipation, anorexia and change in bowel habit.  Genitourinary: Positive for dysuria.  Negative for urgency, frequency, hematuria, flank pain, scrotal swelling, difficulty urinating and penile pain.  Musculoskeletal: Negative for myalgias, joint swelling, arthralgias and neck pain.  Skin: Negative for rash.  Neurological: Negative for vertigo, numbness and headaches.  All other systems reviewed and are negative.     Allergies  Review of patient's allergies indicates no known allergies.  Home Medications   Prior to Admission medications   Medication Sig Start Date End Date Taking? Authorizing Provider  ciprofloxacin (CIPRO) 500 MG tablet Take 1 tablet (500 mg total) by mouth 2 (two) times daily. One po bid x 7 days 09/18/14   Dahlia Client Muthersbaugh, PA-C  esomeprazole (NEXIUM) 40 MG capsule Take 40 mg by mouth daily as needed. For reflux    Historical Provider, MD  gemfibrozil (LOPID) 600 MG tablet Take 600-900 mg by mouth 2 (two) times daily before a meal.  in AM and  in PM    Historical Provider, MD  lisinopril (PRINIVIL,ZESTRIL) 20 MG tablet Take 20 mg by mouth daily.    Historical Provider, MD   BP 139/91 mmHg  Pulse 83  Temp(Src) 98 F (36.7 C) (Oral)  Resp 18  SpO2 96% Physical Exam  Constitutional: He appears well-developed and well-nourished. No distress.  HENT:  Head: Normocephalic and atraumatic.  Eyes: Conjunctivae are normal. No scleral icterus.  Neck: Normal range of motion. Neck supple.  Cardiovascular: Normal rate, regular rhythm and normal heart sounds.   Pulmonary/Chest: Effort normal and breath sounds normal. No respiratory distress.  Abdominal: Soft. Bowel sounds are normal. He exhibits no distension and no mass. There is no tenderness. There is no guarding.  No CVA tenderness  Musculoskeletal: He exhibits no edema.  Neurological: He is alert.  Skin: Skin is warm and dry. He is not diaphoretic.  Psychiatric: His behavior is normal.  Nursing note and vitals reviewed.   ED Course  Procedures (including critical care time) Labs  Review Labs Reviewed  URINALYSIS, ROUTINE W REFLEX MICROSCOPIC - Abnormal; Notable for the following:    APPearance TURBID (*)    Hgb urine dipstick LARGE (*)    Protein, ur 100 (*)    Leukocytes, UA LARGE (*)    All other components within normal limits  COMPREHENSIVE METABOLIC PANEL - Abnormal; Notable for the following:    Glucose, Bld 144 (*)    BUN 24 (*)    Creatinine, Ser 1.57 (*)    Alkaline Phosphatase 123 (*)    GFR calc non Af Amer 42 (*)    GFR calc Af Amer 48 (*)    All other components within normal limits  URINE MICROSCOPIC-ADD ON - Abnormal; Notable for the following:    Squamous Epithelial / LPF FEW (*)    Bacteria, UA MANY (*)    All other components within normal limits  URINE CULTURE  CBC WITH DIFFERENTIAL/PLATELET    Imaging Review No results found.   EKG Interpretation None      MDM   Final diagnoses:  UTI (lower urinary tract infection)    2:23 PM BP 139/91 mmHg  Pulse 83  Temp(Src) 98 F (36.7 C) (Oral)  Resp 18  SpO2 96% Patient with urinary tract infection. He is anxious to leave, and although there is translation services. The patient is very difficult to speak with. The patient agreed to get a shot of IM Rocephin. He will be discharged with  Prescription for Cipro and hand. He will follow-up with his primary care physician. I gave the patient a printout of his lab results today. History elevation in his creatinine and BUN. Glucose is slightly elevated. His urine will be sent for culture. He appears safe for discharge at this time.   Arthor Captainbigail Lynita Groseclose, PA-C 09/19/14 1424  Toy BakerAnthony T Allen, MD 09/22/14 [phone]

## 2016-11-21 ENCOUNTER — Ambulatory Visit (HOSPITAL_COMMUNITY)
Admission: EM | Admit: 2016-11-21 | Discharge: 2016-11-21 | Disposition: A | Discharge status: Home / Self Care | Payer: Medicaid Other | Attending: Internal Medicine | Admitting: Internal Medicine

## 2016-11-21 ENCOUNTER — Encounter (HOSPITAL_COMMUNITY): Payer: Self-pay | Admitting: *Deleted

## 2016-11-21 DIAGNOSIS — G510 Bell's palsy: Secondary | ICD-10-CM

## 2016-11-21 MED ORDER — PREDNISONE 20 MG PO TABS: ORAL_TABLET | ORAL | 0 refills | Status: DC

## 2016-11-21 MED ORDER — ARTIFICIAL TEARS OP OINT: TOPICAL_OINTMENT | OPHTHALMIC | 2 refills | Status: DC | PRN

## 2016-11-21 MED ORDER — VALACYCLOVIR HCL 1 G PO TABS: 1000.0000 mg | ORAL_TABLET | Freq: Three times a day (TID) | ORAL | 0 refills | Status: DC

## 2016-11-21 NOTE — ED Triage Notes (Signed)
Pt  Reports l   Side  Face       Blurred  Vision  l  Eye          Symptoms  Started  yester day     Hand  Grips   Equal         And  noderate  Awake  And  Alert  And  Oriented    Ambulated  To  Room      checked  By  Bill  Palsy

## 2016-11-21 NOTE — ED Provider Notes (Signed)
CSN: [id number]     Arrival date & time 11/21/16  1047 History   First MD Initiated Contact with Patient 11/21/16 1326     Chief Complaint  Patient presents with  . Facial Pain   (Consider location/radiation/quality/duration/timing/severity/associated sxs/prior Treatment) 76 year old male presents to the urgent care accompanied by a significant other who helps him to speaking relation complaining of pain in the back of the neck after he had been working yesterday. His other concern is that of weakness to the right side of the face. Denies any known trauma. No recent injury. No known recent tick bites.      Past Medical History:  Diagnosis Date  . GERD (gastroesophageal reflux disease)   . Hyperlipidemia   . Hypertension   . Initial insomnia   . Umbilical hernia    Past Surgical History:  Procedure Laterality Date  . HERNIA REPAIR     2  . UMBILICAL HERNIA REPAIR  11/22/2011   Procedure: HERNIA REPAIR UMBILICAL ADULT;  Surgeon: Liz Malady, MD;  Location: [facility] OR;  Service: General;  Laterality: N/A;   History reviewed. No pertinent family history. Social History  Substance Use Topics  . Smoking status: Never Smoker  . Smokeless tobacco: Never Used  . Alcohol use No    Review of Systems  Constitutional: Negative for activity change and fever.  HENT: Positive for sore throat. Negative for dental problem.        Facial weakness as per history of present illness Change in taste.  Eyes: Positive for visual disturbance.       Visual disturbance of the left eye pre existing prior to the facial weakness.Marland Kitchen He states he is going to see his optometrist today.  Respiratory: Negative.  Negative for shortness of breath.   Gastrointestinal: Negative.   Genitourinary: Negative.   Musculoskeletal: Positive for neck pain. Negative for myalgias.  Skin: Negative.   Neurological: Positive for facial asymmetry, speech difficulty and weakness. Negative for dizziness, tremors,  light-headedness and headaches.  All other systems reviewed and are negative.   Allergies  Patient has no known allergies.  Home Medications   Prior to Admission medications   Medication Sig Start Date End Date Taking? Authorizing Provider  artificial tears (LACRILUBE) OINT ophthalmic ointment Place into the right eye every 3 (three) hours as needed for dry eyes. 11/21/16   Hayden Rasmussen, NP  esomeprazole (NEXIUM) 40 MG capsule Take 40 mg by mouth daily as needed. For reflux    Historical Provider, MD  gemfibrozil (LOPID) 600 MG tablet Take 600-900 mg by mouth 2 (two) times daily before a meal.  in AM and  in PM    Historical Provider, MD  lisinopril (PRINIVIL,ZESTRIL) 20 MG tablet Take 20 mg by mouth daily.    Historical Provider, MD  predniSONE (DELTASONE) 20 MG tablet Take 3 tabs po daily for 7 days 11/21/16   Hayden Rasmussen, NP  valACYclovir (VALTREX) 1000 MG tablet Take 1 tablet (1,000 mg total) by mouth 3 (three) times daily. 11/21/16   Hayden Rasmussen, NP   Meds Ordered and Administered this Visit  Medications - No data to display  BP (!) 175/80 (BP Location: Right Arm)   Pulse 81   Temp 98.6 F (37 C) (Oral)   Resp 12   SpO2 97%  No data found.   Physical Exam  Constitutional: He is oriented to person, place, and time. He appears well-developed and well-nourished. No distress.  HENT:  Head: Normocephalic and atraumatic.  Right  Ear: External ear normal.  Left Ear: External ear normal.  Mouth/Throat: No oropharyngeal exudate.  Right EAC occluding the TM with cerumen. Left TM normal. Oropharynx with clear PND. No swelling or exudate. It is noticed that the left arch rises above the right. Swallowing is intact. Tongue is midline and normal follow left vent to right.  Eyes: EOM are normal. Pupils are equal, round, and reactive to light.  Neck: Normal range of motion. Neck supple.  Tenderness to the posterior neck musculature. Neck musculature intact. Able to rotate head left and  right against resistance equally.  Cardiovascular: Normal rate.   Pulmonary/Chest: Effort normal.  Musculoskeletal: He exhibits no edema.  Patient is able to move arms and legs normally. Strength in upper and lower extremities is 5 over 5 and symmetric.  Lymphadenopathy:    He has no cervical adenopathy.  Neurological: He is alert and oriented to person, place, and time. A cranial nerve deficit is present.  There is right facial weakness. Weakness to the right eyelid area weakness to the facial musculature with a droop of the right, mild  decrease to labial fold. Absence of wrinkling on the right forehead.  Skin: Skin is warm and dry.  Psychiatric: He has a normal mood and affect.  Nursing note and vitals reviewed.   Urgent Care Course     Procedures (including critical care time)  Labs Review Labs Reviewed - No data to display  Imaging Review No results found.   Visual Acuity Review  Right Eye Distance:   Left Eye Distance:   Bilateral Distance:    Right Eye Near:   Left Eye Near:    Bilateral Near:         MDM   1. Right-sided Bell's palsy    Take the medication as directed. Follow-up with your eye doctor soon. Read instructions regarding your diagnosis. Follow-up with your primary care doctor this month. Meds ordered this encounter  Medications  . predniSONE (DELTASONE) 20 MG tablet    Sig: Take 3 tabs po daily for 7 days    Dispense:  21 tablet    Refill:  0    Order Specific Question:   Supervising Provider    Answer:   Eustace Moore [[id number]]  . valACYclovir (VALTREX) 1000 MG tablet    Sig: Take 1 tablet (1,000 mg total) by mouth 3 (three) times daily.    Dispense:  21 tablet    Refill:  0    Order Specific Question:   Supervising Provider    Answer:   Eustace Moore [[id number]]  . artificial tears (LACRILUBE) OINT ophthalmic ointment    Sig: Place into the right eye every 3 (three) hours as needed for dry eyes.    Dispense:  3.5 g    Refill:  2     Order Specific Question:   Supervising Provider    Answer:   Eustace Moore [[id number]]       Hayden Rasmussen, NP 11/21/16 1344

## 2016-11-21 NOTE — Discharge Instructions (Signed)
Take the medication as directed. Follow-up with your eye doctor soon. Read instructions regarding your diagnosis. Follow-up with your primary care doctor this month.

## 2016-11-23 ENCOUNTER — Ambulatory Visit (HOSPITAL_COMMUNITY)
Admission: EM | Admit: 2016-11-23 | Discharge: 2016-11-23 | Disposition: A | Discharge status: Home / Self Care | Payer: Medicaid Other

## 2016-11-23 NOTE — ED Notes (Signed)
Patient was not seen by a provider, patient needed help with understanding of insurance. Will discharge patient out of system. No charges to patient.

## 2016-11-23 NOTE — ED Notes (Signed)
Patient did not get medicines, too expensive.  Patient has a gap in coverage.  Daniel Porter, patient rep

## 2016-12-01 ENCOUNTER — Encounter (HOSPITAL_COMMUNITY): Payer: Self-pay

## 2016-12-01 ENCOUNTER — Inpatient Hospital Stay (HOSPITAL_COMMUNITY)
Admission: EM | Admit: 2016-12-01 | Discharge: 2016-12-07 | DRG: 871 | Disposition: A | Discharge status: Home Health | Payer: Medicare Other | Attending: Internal Medicine | Admitting: Internal Medicine

## 2016-12-01 ENCOUNTER — Emergency Department (HOSPITAL_COMMUNITY): Payer: Medicare Other

## 2016-12-01 DIAGNOSIS — E1122 Type 2 diabetes mellitus with diabetic chronic kidney disease: Secondary | ICD-10-CM | POA: Diagnosis present

## 2016-12-01 DIAGNOSIS — R6520 Severe sepsis without septic shock: Secondary | ICD-10-CM | POA: Diagnosis not present

## 2016-12-01 DIAGNOSIS — A411 Sepsis due to other specified staphylococcus: Secondary | ICD-10-CM | POA: Diagnosis not present

## 2016-12-01 DIAGNOSIS — G510 Bell's palsy: Secondary | ICD-10-CM | POA: Diagnosis present

## 2016-12-01 DIAGNOSIS — A419 Sepsis, unspecified organism: Secondary | ICD-10-CM

## 2016-12-01 DIAGNOSIS — Z87440 Personal history of urinary (tract) infections: Secondary | ICD-10-CM

## 2016-12-01 DIAGNOSIS — N390 Urinary tract infection, site not specified: Secondary | ICD-10-CM

## 2016-12-01 DIAGNOSIS — G9340 Encephalopathy, unspecified: Secondary | ICD-10-CM | POA: Diagnosis not present

## 2016-12-01 DIAGNOSIS — N281 Cyst of kidney, acquired: Secondary | ICD-10-CM | POA: Diagnosis not present

## 2016-12-01 DIAGNOSIS — N183 Chronic kidney disease, stage 3 unspecified: Secondary | ICD-10-CM | POA: Diagnosis present

## 2016-12-01 DIAGNOSIS — E785 Hyperlipidemia, unspecified: Secondary | ICD-10-CM | POA: Diagnosis not present

## 2016-12-01 DIAGNOSIS — N136 Pyonephrosis: Secondary | ICD-10-CM | POA: Diagnosis not present

## 2016-12-01 DIAGNOSIS — R51 Headache: Secondary | ICD-10-CM | POA: Diagnosis not present

## 2016-12-01 DIAGNOSIS — Y92230 Patient room in hospital as the place of occurrence of the external cause: Secondary | ICD-10-CM | POA: Diagnosis not present

## 2016-12-01 DIAGNOSIS — Z79899 Other long term (current) drug therapy: Secondary | ICD-10-CM | POA: Diagnosis not present

## 2016-12-01 DIAGNOSIS — E119 Type 2 diabetes mellitus without complications: Secondary | ICD-10-CM

## 2016-12-01 DIAGNOSIS — R338 Other retention of urine: Secondary | ICD-10-CM | POA: Diagnosis not present

## 2016-12-01 DIAGNOSIS — R402412 Glasgow coma scale score 13-15, at arrival to emergency department: Secondary | ICD-10-CM | POA: Diagnosis present

## 2016-12-01 DIAGNOSIS — T8384XA Pain from genitourinary prosthetic devices, implants and grafts, initial encounter: Secondary | ICD-10-CM | POA: Diagnosis not present

## 2016-12-01 DIAGNOSIS — N323 Diverticulum of bladder: Secondary | ICD-10-CM | POA: Diagnosis not present

## 2016-12-01 DIAGNOSIS — E860 Dehydration: Secondary | ICD-10-CM | POA: Diagnosis present

## 2016-12-01 DIAGNOSIS — N401 Enlarged prostate with lower urinary tract symptoms: Secondary | ICD-10-CM | POA: Diagnosis not present

## 2016-12-01 DIAGNOSIS — R451 Restlessness and agitation: Secondary | ICD-10-CM | POA: Diagnosis not present

## 2016-12-01 DIAGNOSIS — N3289 Other specified disorders of bladder: Secondary | ICD-10-CM | POA: Diagnosis present

## 2016-12-01 DIAGNOSIS — E1165 Type 2 diabetes mellitus with hyperglycemia: Secondary | ICD-10-CM | POA: Diagnosis present

## 2016-12-01 DIAGNOSIS — Y846 Urinary catheterization as the cause of abnormal reaction of the patient, or of later complication, without mention of misadventure at the time of the procedure: Secondary | ICD-10-CM | POA: Diagnosis not present

## 2016-12-01 DIAGNOSIS — I129 Hypertensive chronic kidney disease with stage 1 through stage 4 chronic kidney disease, or unspecified chronic kidney disease: Secondary | ICD-10-CM | POA: Diagnosis present

## 2016-12-01 DIAGNOSIS — D696 Thrombocytopenia, unspecified: Secondary | ICD-10-CM | POA: Diagnosis not present

## 2016-12-01 DIAGNOSIS — N3001 Acute cystitis with hematuria: Secondary | ICD-10-CM | POA: Diagnosis present

## 2016-12-01 DIAGNOSIS — E871 Hypo-osmolality and hyponatremia: Secondary | ICD-10-CM | POA: Diagnosis not present

## 2016-12-01 DIAGNOSIS — K219 Gastro-esophageal reflux disease without esophagitis: Secondary | ICD-10-CM | POA: Diagnosis present

## 2016-12-01 DIAGNOSIS — N1330 Unspecified hydronephrosis: Secondary | ICD-10-CM

## 2016-12-01 HISTORY — DX: Type 2 diabetes mellitus without complications: E11.9

## 2016-12-01 LAB — COMPREHENSIVE METABOLIC PANEL
ALBUMIN: 3.5 g/dL (ref 3.5–5.0)
ALK PHOS: 124 U/L (ref 38–126)
ALT: 32 U/L (ref 17–63)
AST: 29 U/L (ref 15–41)
Anion gap: 11 (ref 5–15)
BUN: 27 mg/dL — ABNORMAL HIGH (ref 6–20)
CALCIUM: 10 mg/dL (ref 8.9–10.3)
CO2: 21 mmol/L — AB (ref 22–32)
CREATININE: 1.48 mg/dL — AB (ref 0.61–1.24)
Chloride: 99 mmol/L — ABNORMAL LOW (ref 101–111)
GFR calc Af Amer: 51 mL/min — ABNORMAL LOW (ref >60)
GFR calc non Af Amer: 44 mL/min — ABNORMAL LOW (ref >60)
GLUCOSE: 384 mg/dL — AB (ref 65–99)
Potassium: 4 mmol/L (ref 3.5–5.1)
SODIUM: 131 mmol/L — AB (ref 135–145)
Total Bilirubin: 1.1 mg/dL (ref 0.3–1.2)
Total Protein: 7.1 g/dL (ref 6.5–8.1)

## 2016-12-01 LAB — CBC WITH DIFFERENTIAL/PLATELET
BASOS PCT: 0 %
Basophils Absolute: 0 10*3/uL (ref 0.0–0.1)
EOS ABS: 0 10*3/uL (ref 0.0–0.7)
Eosinophils Relative: 0 %
HEMATOCRIT: 43.2 % (ref 39.0–52.0)
Hemoglobin: 15.1 g/dL (ref 13.0–17.0)
Lymphocytes Relative: 5 %
Lymphs Abs: 0.6 10*3/uL — ABNORMAL LOW (ref 0.7–4.0)
MCH: 28.5 pg (ref 26.0–34.0)
MCHC: 35 g/dL (ref 30.0–36.0)
MCV: 81.7 fL (ref 78.0–100.0)
MONOS PCT: 6 %
Monocytes Absolute: 0.7 10*3/uL (ref 0.1–1.0)
NEUTROS ABS: 10.7 10*3/uL — AB (ref 1.7–7.7)
Neutrophils Relative %: 89 %
Platelets: 132 10*3/uL — ABNORMAL LOW (ref 150–400)
RBC: 5.29 MIL/uL (ref 4.22–5.81)
RDW: 13.4 % (ref 11.5–15.5)
WBC: 12 10*3/uL — ABNORMAL HIGH (ref 4.0–10.5)

## 2016-12-01 LAB — PROTIME-INR
INR: 1.08
Prothrombin Time: 14.1 seconds (ref 11.4–15.2)

## 2016-12-01 LAB — I-STAT CHEM 8, ED
BUN: 29 mg/dL — ABNORMAL HIGH (ref 6–20)
CALCIUM ION: 1.24 mmol/L (ref 1.15–1.40)
CHLORIDE: 99 mmol/L — AB (ref 101–111)
CREATININE: 1.4 mg/dL — AB (ref 0.61–1.24)
GLUCOSE: 395 mg/dL — AB (ref 65–99)
HCT: 46 % (ref 39.0–52.0)
Hemoglobin: 15.6 g/dL (ref 13.0–17.0)
Potassium: 4.1 mmol/L (ref 3.5–5.1)
Sodium: 134 mmol/L — ABNORMAL LOW (ref 135–145)
TCO2: 23 mmol/L (ref 0–100)

## 2016-12-01 LAB — I-STAT CG4 LACTIC ACID, ED: Lactic Acid, Venous: 2.05 mmol/L (ref 0.5–1.9)

## 2016-12-01 MED ORDER — SODIUM CHLORIDE 0.9 % IV BOLUS (SEPSIS)
1000.0000 mL | Freq: Once | INTRAVENOUS | Status: AC
  Administered 2016-12-02: 1000 mL via INTRAVENOUS

## 2016-12-01 MED ORDER — SODIUM CHLORIDE 0.9 % IV BOLUS (SEPSIS)
1000.0000 mL | Freq: Once | INTRAVENOUS | Status: AC
  Administered 2016-12-01: 1000 mL via INTRAVENOUS

## 2016-12-01 MED ORDER — PIPERACILLIN-TAZOBACTAM 3.375 G IVPB 30 MIN
3.3750 g | Freq: Once | INTRAVENOUS | Status: AC
  Administered 2016-12-01: 3.375 g via INTRAVENOUS
  Filled 2016-12-01: qty 50

## 2016-12-01 MED ORDER — ACETAMINOPHEN 650 MG RE SUPP
650.0000 mg | Freq: Once | RECTAL | Status: AC
  Administered 2016-12-01: 650 mg via RECTAL
  Filled 2016-12-01: qty 1

## 2016-12-01 MED ORDER — VANCOMYCIN HCL IN DEXTROSE 1-5 GM/200ML-% IV SOLN: 1000.0000 mg | Freq: Once | INTRAVENOUS | Status: DC

## 2016-12-01 MED ORDER — VANCOMYCIN HCL 10 G IV SOLR
1500.0000 mg | Freq: Once | INTRAVENOUS | Status: AC
  Administered 2016-12-01: 1500 mg via INTRAVENOUS
  Filled 2016-12-01: qty 1500

## 2016-12-01 MED ORDER — ONDANSETRON HCL 4 MG/2ML IJ SOLN
4.0000 mg | Freq: Once | INTRAMUSCULAR | Status: AC
  Administered 2016-12-01: 4 mg via INTRAVENOUS
  Filled 2016-12-01: qty 2

## 2016-12-01 NOTE — ED Provider Notes (Signed)
[facility]-EMERGENCY DEPT Provider Note   CSN: [id number] Arrival date & time: 12/01/16  2258    History   Chief Complaint Chief Complaint  Patient presents with  . Altered Mental Status    LEVEL 5 CAVEAT 2/2 ACUITY OF CONDITION AND ALTERED MENTAL STATUS  HPI Daniel Porter is a 76 y.o. male.  76 year old male with history of esophageal reflux, dyslipidemia, hypertension presents to the emergency department for altered mental status. Daughter is at bedside. She notes that the patient's son began to notice that he was not acting like himself tonight. Patient has complaints of chills as well as nausea. He also complains of mild abdominal pain. He denies dysuria. No vomiting. Patient does not contribute much to history as he is agitated and slow to respond. Daughter denies known sick contacts. 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  Level V caveat applies.      Past Medical History:  Diagnosis Date  . GERD (gastroesophageal reflux disease)   . Hyperlipidemia   . Hypertension   . Initial insomnia   . Umbilical hernia     Patient Active Problem List   Diagnosis Date Noted  . Recurrent umbilical hernia 10/17/2012  . S/P umbilical hernia repair, follow-up exam 11/30/2011  . Umbilical hernia 10/12/2011  . Right inguinal hernia 10/12/2011    Past Surgical History:  Procedure Laterality Date  . HERNIA REPAIR     2  . UMBILICAL HERNIA REPAIR  11/22/2011   Procedure: HERNIA REPAIR UMBILICAL ADULT;  Surgeon: Liz Malady, MD;  Location: [facility] OR;  Service: General;  Laterality: N/A;       Home Medications    Prior to Admission medications   Medication Sig Start Date End Date Taking? Authorizing Provider  PRESCRIPTION MEDICATION Apply topically daily. Face Cream   Yes Historical Provider, MD  artificial tears (LACRILUBE) OINT ophthalmic ointment Place into the right eye every 3 (three) hours as needed for dry eyes. Patient not taking: Reported on 12/02/2016  11/21/16   Hayden Rasmussen, NP  predniSONE (DELTASONE) 20 MG tablet Take 3 tabs po daily for 7 days Patient not taking: Reported on 12/02/2016 11/21/16   Hayden Rasmussen, NP  valACYclovir (VALTREX) 1000 MG tablet Take 1 tablet (1,000 mg total) by mouth 3 (three) times daily. Patient not taking: Reported on 12/02/2016 11/21/16   Hayden Rasmussen, NP    Family History History reviewed. No pertinent family history.  Social History Social History  Substance Use Topics  . Smoking status: Never Smoker  . Smokeless tobacco: Never Used  . Alcohol use No     Allergies   Patient has no known allergies.   Review of Systems Review of Systems Ten systems reviewed and are negative for acute change, except as noted in the HPI.    Physical Exam Updated Vital Signs BP (!) 149/125   Pulse (!) 115   Temp (S) (!) 102.1 F (38.9 C) (Rectal)   Resp (!) 21   SpO2 98%   Physical Exam  Constitutional: He appears well-developed and well-nourished. No distress.  Patient ill appearing; agitated  HENT:  Head: Normocephalic and atraumatic.  Eyes: Conjunctivae and EOM are normal. No scleral icterus.  Neck: Normal range of motion.  Cardiovascular: Regular rhythm and intact distal pulses.   Tachycardia  Pulmonary/Chest: Effort normal. No respiratory distress. He has no wheezes. He has no rales.  No tachypnea or dyspnea. Chest expansion symmetric. Lungs grossly clear.  Abdominal: Soft. He exhibits distension.  Soft distended abdomen. No focal TTP. No rigidity or peritoneal signs.  Musculoskeletal: Normal range of motion.  Neurological: He is alert. He exhibits normal muscle tone. Coordination normal.  GCS 15. Patient moving all extremities.  Skin: Skin is warm and dry. No rash noted. He is not diaphoretic. No erythema. No pallor.  Nursing note and vitals reviewed.    ED Treatments / Results  Labs (all labs ordered are listed, but only abnormal results are displayed) Labs Reviewed  COMPREHENSIVE METABOLIC PANEL -  Abnormal; Notable for the following:       Result Value   Sodium 131 (*)    Chloride 99 (*)    CO2 21 (*)    Glucose, Bld 384 (*)    BUN 27 (*)    Creatinine, Ser 1.48 (*)    GFR calc non Af Amer 44 (*)    GFR calc Af Amer 51 (*)    All other components within normal limits  CBC WITH DIFFERENTIAL/PLATELET - Abnormal; Notable for the following:    WBC 12.0 (*)    Platelets 132 (*)    Neutro Abs 10.7 (*)    Lymphs Abs 0.6 (*)    All other components within normal limits  URINALYSIS, ROUTINE W REFLEX MICROSCOPIC - Abnormal; Notable for the following:    APPearance CLOUDY (*)    Glucose, UA >=500 (*)    Hgb urine dipstick LARGE (*)    Protein, ur 30 (*)    Nitrite POSITIVE (*)    Leukocytes, UA LARGE (*)    Bacteria, UA MANY (*)    All other components within normal limits  I-STAT CG4 LACTIC ACID, ED - Abnormal; Notable for the following:    Lactic Acid, Venous 2.05 (*)    All other components within normal limits  I-STAT CHEM 8, ED - Abnormal; Notable for the following:    Sodium 134 (*)    Chloride 99 (*)    BUN 29 (*)    Creatinine, Ser 1.40 (*)    Glucose, Bld 395 (*)    All other components within normal limits  CULTURE, BLOOD (ROUTINE X 2)  CULTURE, BLOOD (ROUTINE X 2)  URINE CULTURE  RESPIRATORY PANEL BY PCR  PROTIME-INR  I-STAT CG4 LACTIC ACID, ED    EKG  EKG Interpretation  Date/Time:  Thursday December 01 2016 23:03:37 EDT Ventricular Rate:  117 PR Interval:    QRS Duration: 88 QT Interval:  296 QTC Calculation: 413 R Axis:   30 Text Interpretation:  Sinus tachycardia Otherwise within normal limits Confirmed by POLLINA  MD, CHRISTOPHER [phone]) on 12/01/2016 11:25:46 PM       Radiology Dg Chest Port 1 View  Result Date: 12/01/2016 CLINICAL DATA:  Altered mental status, onset today.  Fever. EXAM: PORTABLE CHEST 1 VIEW COMPARISON:  11/16/2011 FINDINGS: Shallow inspiration. Linear atelectasis in the lung bases. Heart size and pulmonary vascularity are  normal for technique. No focal consolidation. No blunting of costophrenic angles. No pneumothorax. Mediastinal contours appear intact. Degenerative changes in the spine. IMPRESSION: Shallow inspiration with linear atelectasis in the lung bases. Electronically Signed   By: Burman Nieves M.D.   On: 12/01/2016 23:41   Ct Renal Stone Study  Result Date: 12/02/2016 CLINICAL DATA:  Hematuria and sepsis.  Combative. EXAM: CT ABDOMEN AND PELVIS WITHOUT CONTRAST TECHNIQUE: Multidetector CT imaging of the abdomen and pelvis was performed following the standard protocol without IV contrast. COMPARISON:  None. FINDINGS: Lower chest: Atelectasis in the lung bases. Motion  artifact limits evaluation. Coronary artery calcifications. Small esophageal hiatal hernia. Hepatobiliary: No focal liver abnormality is seen. No gallstones, gallbladder wall thickening, or biliary dilatation. Pancreas: Unremarkable. No pancreatic ductal dilatation or surrounding inflammatory changes. Spleen: Spleen is mildly enlarged.  No focal lesions. Adrenals/Urinary Tract: No adrenal gland nodules. There is right hydronephrosis and hydroureter down to the level of the bladder. No obstructing stones are visualized. In the right pelvis adjacent to the bladder and displacing the bladder towards the left, there is a large cystic mass lesion. The lesion measures 8.3 x 10.9 cm. The mass displaces the right ureter towards the left. The ureter extends between the mass in the bladder. 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 This could possibly represent a large ureterocele. More likely, this represents a mesenteric cyst or other cystic mass. The bladder wall is not thickened and no bladder filling defects are demonstrated. No hydronephrosis of the left kidney. Parenchymal atrophy involving both kidneys. Small exophytic mass lesions arising from both  kidneys are incompletely characterized on noncontrast imaging but likely represent cysts. The bladder is mildly distended suggesting possible bladder outlet obstruction. Stomach/Bowel: There is a moderate size right inguinal hernia containing a portion of the sigmoid colon. No proximal obstruction. There is adjacent fat herniation in the right inguinal region possibly representing a femoral hernia. There is a ventral anterior abdominal wall hernia to the left of midline containing small bowel. Herniated bowel are decompressed. No evidence of proximal obstruction. The appendix is normal. Stomach, small bowel, and colon are mostly decompressed without abnormal distention. Vascular/Lymphatic: Aortic atherosclerosis. No enlarged abdominal or pelvic lymph nodes. Reproductive: Prostate gland is enlarged, measuring 6.5 cm diameter. Other: No free air or free fluid in the abdomen. Musculoskeletal: Degenerative changes in the spine. No destructive bone lesions. IMPRESSION: 1. Large cystic mass in the right pelvis abutting the bladder. This may be causing extrinsic compression of the right ureter. There is right-sided hydronephrosis and hydroureter without a stone identified. Etiology of the mass is uncertain. Could represent a bladder diverticulum all of the bladder wall appears to be continuous. The could represent a large ureterocele. It could represent a mesenteric mass or other cystic mass. Consider further evaluation with MRI. 2. Right inguinal hernia containing sigmoid colon without proximal obstruction. Left anterior ventral abdominal wall hernia containing small bowel without proximal obstruction. 3. Prominent enlargement of the prostate gland with moderate bladder distention possibly indicating outlet obstruction. 4. Small bilateral renal cysts. Bilateral renal parenchymal atrophy. Small esophageal hiatal hernia. Electronically Signed   By: Burman Nieves M.D.   On: 12/02/2016 01:10    Procedures Procedures  (including critical care time)  Medications Ordered in ED Medications  sodium chloride 0.9 % bolus 1,000 mL (0 mLs Intravenous Stopped 12/02/16 0154)    And  sodium chloride 0.9 % bolus 1,000 mL (0 mLs Intravenous Stopped 12/02/16 0154)    And  sodium chloride 0.9 % bolus 1,000 mL (not administered)  piperacillin-tazobactam (ZOSYN) IVPB 3.375 g (0 g Intravenous Stopped 12/02/16 0154)  acetaminophen (TYLENOL) suppository 650 mg (650 mg Rectal Given 12/01/16 2339)  ondansetron (ZOFRAN) injection 4 mg (4 mg Intravenous Given 12/01/16 2339)  vancomycin (VANCOCIN) 1,500 mg in sodium chloride 0.9 % 500 mL IVPB (1,500 mg Intravenous New Bag/Given 12/01/16 2357)     Initial Impression / Assessment and Plan / ED Course  I have reviewed the triage vital signs  and the nursing notes.  Pertinent labs & imaging results that were available during my care of the patient were reviewed by me and considered in my medical decision making (see chart for details).     11:30 PM Patient presenting for AMS and fever. Patient meets sepsis criteria on arrival. Symptoms are nonspecific and patient largely unable to contribute to history given signs of delirium. Broad spectrum antibiotics ordered as well as weight based IVF.  12:45 AM Patient reassessed. He is much more awake, alert, and conversant. He expresses that he does not like hospitals because his wife died following an admission here.   1:30 AM CT findings reviewed. No ureterolithiasis, though high suspicion for bladder diverticulum. 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 Case discussed with Dr. Mena Goes of [facility] who recommended that patient have a foley catheter placed to decompress the bladder. Dr. Mena Goes to review CT imaging to provide further recommendations.  2:00 AM No additional recommendations, per Dr. Mena Goes. He will also see the patient in consultation.  Consult placed to [facility] for admission for sepsis.  2:55 AM Case discussed with Dr. Julian Reil of [facility] who will admit.   Vitals:   12/01/16 2345 12/02/16 0015 12/02/16 0030 12/02/16 0045  BP:  (!) 121/97 (!) 141/82 (!) 149/125  Pulse: (!) 106 (!) 108 (!) 108 (!) 115  Resp: (!) 35 14 (!) 35 (!) 21  Temp:      TempSrc:      SpO2: 94% 98% 97% 98%    Final Clinical Impressions(s) / ED Diagnoses   Final diagnoses:  Sepsis, due to unspecified organism [facility])  Acute cystitis with hematuria    New Prescriptions New Prescriptions   No medications on file     Antony Madura, PA-C 12/02/16 0256    Gilda Crease, MD 12/02/16 [phone]

## 2016-12-01 NOTE — ED Triage Notes (Signed)
Patient comes by EMS for AMS onset of today per family.  Temp was 105 temporal for EMS.  102.1 rectal here.  Patient is arabic speaking and poor historian. Does have Bell's Palsy.  EMS gave 400 cc fluids no tylenol.

## 2016-12-01 NOTE — ED Provider Notes (Signed)
Patient presented to the ER with generalized weakness and fever.  Face to face Exam: HEENT - PERRLA Lungs - CTAB Heart - RRR, no M/R/G Abd - S/NT/ND Neuro - alert, sluggish to respond  Plan: Presents with fever, tachycardia, concern for sepsis. Source unclear at this time. Workup for source, empiric treatment.   Gilda Crease, MD 12/01/16 2329

## 2016-12-02 ENCOUNTER — Encounter (HOSPITAL_COMMUNITY): Payer: Self-pay | Admitting: Emergency Medicine

## 2016-12-02 ENCOUNTER — Encounter (HOSPITAL_COMMUNITY): Payer: Self-pay | Admitting: *Deleted

## 2016-12-02 ENCOUNTER — Inpatient Hospital Stay (HOSPITAL_COMMUNITY): Payer: Medicare Other

## 2016-12-02 ENCOUNTER — Emergency Department (HOSPITAL_COMMUNITY): Payer: Medicare Other

## 2016-12-02 DIAGNOSIS — R402412 Glasgow coma scale score 13-15, at arrival to emergency department: Secondary | ICD-10-CM | POA: Diagnosis not present

## 2016-12-02 DIAGNOSIS — G9340 Encephalopathy, unspecified: Secondary | ICD-10-CM | POA: Diagnosis not present

## 2016-12-02 DIAGNOSIS — N183 Chronic kidney disease, stage 3 unspecified: Secondary | ICD-10-CM | POA: Diagnosis present

## 2016-12-02 DIAGNOSIS — R6520 Severe sepsis without septic shock: Secondary | ICD-10-CM | POA: Diagnosis not present

## 2016-12-02 DIAGNOSIS — E1122 Type 2 diabetes mellitus with diabetic chronic kidney disease: Secondary | ICD-10-CM | POA: Diagnosis not present

## 2016-12-02 DIAGNOSIS — N281 Cyst of kidney, acquired: Secondary | ICD-10-CM | POA: Diagnosis not present

## 2016-12-02 DIAGNOSIS — I129 Hypertensive chronic kidney disease with stage 1 through stage 4 chronic kidney disease, or unspecified chronic kidney disease: Secondary | ICD-10-CM | POA: Diagnosis not present

## 2016-12-02 DIAGNOSIS — A419 Sepsis, unspecified organism: Secondary | ICD-10-CM | POA: Diagnosis present

## 2016-12-02 DIAGNOSIS — N3289 Other specified disorders of bladder: Secondary | ICD-10-CM | POA: Diagnosis present

## 2016-12-02 DIAGNOSIS — N136 Pyonephrosis: Secondary | ICD-10-CM

## 2016-12-02 DIAGNOSIS — D696 Thrombocytopenia, unspecified: Secondary | ICD-10-CM | POA: Diagnosis not present

## 2016-12-02 DIAGNOSIS — E119 Type 2 diabetes mellitus without complications: Secondary | ICD-10-CM | POA: Diagnosis not present

## 2016-12-02 DIAGNOSIS — K219 Gastro-esophageal reflux disease without esophagitis: Secondary | ICD-10-CM | POA: Diagnosis not present

## 2016-12-02 DIAGNOSIS — N401 Enlarged prostate with lower urinary tract symptoms: Secondary | ICD-10-CM | POA: Diagnosis not present

## 2016-12-02 DIAGNOSIS — Z87440 Personal history of urinary (tract) infections: Secondary | ICD-10-CM | POA: Diagnosis not present

## 2016-12-02 DIAGNOSIS — N323 Diverticulum of bladder: Secondary | ICD-10-CM | POA: Diagnosis not present

## 2016-12-02 DIAGNOSIS — E871 Hypo-osmolality and hyponatremia: Secondary | ICD-10-CM | POA: Diagnosis not present

## 2016-12-02 DIAGNOSIS — E860 Dehydration: Secondary | ICD-10-CM | POA: Diagnosis not present

## 2016-12-02 DIAGNOSIS — R338 Other retention of urine: Secondary | ICD-10-CM | POA: Diagnosis not present

## 2016-12-02 DIAGNOSIS — Y92230 Patient room in hospital as the place of occurrence of the external cause: Secondary | ICD-10-CM | POA: Diagnosis not present

## 2016-12-02 DIAGNOSIS — T8384XA Pain from genitourinary prosthetic devices, implants and grafts, initial encounter: Secondary | ICD-10-CM | POA: Diagnosis not present

## 2016-12-02 DIAGNOSIS — Z79899 Other long term (current) drug therapy: Secondary | ICD-10-CM | POA: Diagnosis not present

## 2016-12-02 DIAGNOSIS — G510 Bell's palsy: Secondary | ICD-10-CM | POA: Diagnosis not present

## 2016-12-02 DIAGNOSIS — N390 Urinary tract infection, site not specified: Secondary | ICD-10-CM | POA: Diagnosis not present

## 2016-12-02 DIAGNOSIS — A411 Sepsis due to other specified staphylococcus: Secondary | ICD-10-CM | POA: Diagnosis not present

## 2016-12-02 DIAGNOSIS — E785 Hyperlipidemia, unspecified: Secondary | ICD-10-CM | POA: Diagnosis not present

## 2016-12-02 DIAGNOSIS — R451 Restlessness and agitation: Secondary | ICD-10-CM | POA: Diagnosis not present

## 2016-12-02 DIAGNOSIS — Y846 Urinary catheterization as the cause of abnormal reaction of the patient, or of later complication, without mention of misadventure at the time of the procedure: Secondary | ICD-10-CM | POA: Diagnosis not present

## 2016-12-02 DIAGNOSIS — E1165 Type 2 diabetes mellitus with hyperglycemia: Secondary | ICD-10-CM | POA: Diagnosis not present

## 2016-12-02 DIAGNOSIS — N3001 Acute cystitis with hematuria: Secondary | ICD-10-CM | POA: Diagnosis present

## 2016-12-02 LAB — GLUCOSE, CAPILLARY
Glucose-Capillary: 217 mg/dL — ABNORMAL HIGH (ref 65–99)
Glucose-Capillary: 271 mg/dL — ABNORMAL HIGH (ref 65–99)
Glucose-Capillary: 283 mg/dL — ABNORMAL HIGH (ref 65–99)
Glucose-Capillary: 285 mg/dL — ABNORMAL HIGH (ref 65–99)
Glucose-Capillary: 358 mg/dL — ABNORMAL HIGH (ref 65–99)

## 2016-12-02 LAB — BLOOD CULTURE ID PANEL (REFLEXED)
Acinetobacter baumannii: NOT DETECTED
Candida albicans: NOT DETECTED
Candida glabrata: NOT DETECTED
Candida krusei: NOT DETECTED
Candida parapsilosis: NOT DETECTED
Candida tropicalis: NOT DETECTED
ENTEROBACTERIACEAE SPECIES: NOT DETECTED
ESCHERICHIA COLI: NOT DETECTED
Enterobacter cloacae complex: NOT DETECTED
Enterococcus species: NOT DETECTED
Haemophilus influenzae: NOT DETECTED
KLEBSIELLA OXYTOCA: NOT DETECTED
KLEBSIELLA PNEUMONIAE: NOT DETECTED
Listeria monocytogenes: NOT DETECTED
METHICILLIN RESISTANCE: NOT DETECTED
NEISSERIA MENINGITIDIS: NOT DETECTED
PSEUDOMONAS AERUGINOSA: NOT DETECTED
Proteus species: NOT DETECTED
STAPHYLOCOCCUS AUREUS BCID: NOT DETECTED
STAPHYLOCOCCUS SPECIES: DETECTED — AB
STREPTOCOCCUS AGALACTIAE: NOT DETECTED
STREPTOCOCCUS PNEUMONIAE: NOT DETECTED
STREPTOCOCCUS SPECIES: NOT DETECTED
Serratia marcescens: NOT DETECTED
Streptococcus pyogenes: NOT DETECTED

## 2016-12-02 LAB — MRSA PCR SCREENING: MRSA by PCR: NEGATIVE

## 2016-12-02 LAB — URINALYSIS, ROUTINE W REFLEX MICROSCOPIC
Bilirubin Urine: NEGATIVE
Glucose, UA: >=500 mg/dL — AB
Ketones, ur: NEGATIVE mg/dL
Nitrite: POSITIVE — AB
Protein, ur: 30 mg/dL — AB
SPECIFIC GRAVITY, URINE: 1.016 (ref 1.005–1.030)
Squamous Epithelial / LPF: NONE SEEN
pH: 7 (ref 5.0–8.0)

## 2016-12-02 LAB — CBG MONITORING, ED
Glucose-Capillary: 411 mg/dL — ABNORMAL HIGH (ref 65–99)
Glucose-Capillary: 370 mg/dL — ABNORMAL HIGH (ref 65–99)

## 2016-12-02 LAB — I-STAT CG4 LACTIC ACID, ED: Lactic Acid, Venous: 1.89 mmol/L (ref 0.5–1.9)

## 2016-12-02 MED ORDER — SODIUM CHLORIDE 0.9 % IV SOLN
INTRAVENOUS | Status: DC
  Administered 2016-12-02 – 2016-12-03 (×3): via INTRAVENOUS

## 2016-12-02 MED ORDER — FONDAPARINUX SODIUM 2.5 MG/0.5ML ~~LOC~~ SOLN
2.5000 mg | SUBCUTANEOUS | Status: DC
  Administered 2016-12-02 – 2016-12-07 (×5): 2.5 mg via SUBCUTANEOUS
  Filled 2016-12-02 (×6): qty 0.5

## 2016-12-02 MED ORDER — SODIUM CHLORIDE 0.9% FLUSH
3.0000 mL | Freq: Two times a day (BID) | INTRAVENOUS | Status: DC
  Administered 2016-12-02 – 2016-12-07 (×4): 3 mL via INTRAVENOUS

## 2016-12-02 MED ORDER — ACETAMINOPHEN 325 MG PO TABS
650.0000 mg | ORAL_TABLET | Freq: Four times a day (QID) | ORAL | Status: DC | PRN
  Administered 2016-12-02 – 2016-12-07 (×9): 650 mg via ORAL
  Filled 2016-12-02 (×9): qty 2

## 2016-12-02 MED ORDER — ARTIFICIAL TEARS OP OINT
TOPICAL_OINTMENT | OPHTHALMIC | Status: DC | PRN
  Filled 2016-12-02: qty 3.5

## 2016-12-02 MED ORDER — INSULIN ASPART 100 UNIT/ML ~~LOC~~ SOLN
0.0000 [IU] | Freq: Every day | SUBCUTANEOUS | Status: DC
  Administered 2016-12-02: 3 [IU] via SUBCUTANEOUS
  Administered 2016-12-03 – 2016-12-06 (×2): 2 [IU] via SUBCUTANEOUS

## 2016-12-02 MED ORDER — INSULIN GLARGINE 100 UNIT/ML ~~LOC~~ SOLN
10.0000 [IU] | Freq: Every day | SUBCUTANEOUS | Status: DC
  Administered 2016-12-02: 10 [IU] via SUBCUTANEOUS
  Filled 2016-12-02 (×2): qty 0.1

## 2016-12-02 MED ORDER — PIPERACILLIN-TAZOBACTAM 3.375 G IVPB
3.3750 g | Freq: Three times a day (TID) | INTRAVENOUS | Status: DC
  Administered 2016-12-02 – 2016-12-04 (×6): 3.375 g via INTRAVENOUS
  Filled 2016-12-02 (×10): qty 50

## 2016-12-02 MED ORDER — VANCOMYCIN HCL IN DEXTROSE 750-5 MG/150ML-% IV SOLN
750.0000 mg | Freq: Two times a day (BID) | INTRAVENOUS | Status: DC
  Administered 2016-12-02 – 2016-12-04 (×5): 750 mg via INTRAVENOUS
  Filled 2016-12-02 (×5): qty 150

## 2016-12-02 MED ORDER — INSULIN ASPART 100 UNIT/ML ~~LOC~~ SOLN
0.0000 [IU] | Freq: Three times a day (TID) | SUBCUTANEOUS | Status: DC
  Administered 2016-12-02: 9 [IU] via SUBCUTANEOUS
  Administered 2016-12-03: 5 [IU] via SUBCUTANEOUS
  Administered 2016-12-03: 3 [IU] via SUBCUTANEOUS
  Administered 2016-12-03: 2 [IU] via SUBCUTANEOUS
  Administered 2016-12-04: 3 [IU] via SUBCUTANEOUS
  Administered 2016-12-04: 2 [IU] via SUBCUTANEOUS
  Administered 2016-12-04: 3 [IU] via SUBCUTANEOUS
  Administered 2016-12-05 (×2): 2 [IU] via SUBCUTANEOUS
  Administered 2016-12-05: 3 [IU] via SUBCUTANEOUS
  Administered 2016-12-06: 1 [IU] via SUBCUTANEOUS
  Administered 2016-12-06: 3 [IU] via SUBCUTANEOUS
  Administered 2016-12-07: 2 [IU] via SUBCUTANEOUS
  Administered 2016-12-07: 1 [IU] via SUBCUTANEOUS

## 2016-12-02 MED ORDER — FONDAPARINUX SODIUM 2.5 MG/0.5ML ~~LOC~~ SOLN
2.5000 mg | Freq: Once | SUBCUTANEOUS | Status: DC
  Filled 2016-12-02: qty 0.5

## 2016-12-02 MED ORDER — FINASTERIDE 5 MG PO TABS
5.0000 mg | ORAL_TABLET | Freq: Every day | ORAL | Status: DC
  Administered 2016-12-02 – 2016-12-07 (×6): 5 mg via ORAL
  Filled 2016-12-02 (×6): qty 1

## 2016-12-02 MED ORDER — LIVING WELL WITH DIABETES BOOK
Freq: Once | Status: AC
  Administered 2016-12-02: 13:00:00
  Filled 2016-12-02: qty 1

## 2016-12-02 MED ORDER — HYDROMORPHONE HCL 1 MG/ML IJ SOLN: 0.5000 mg | INTRAMUSCULAR | Status: DC | PRN

## 2016-12-02 MED ORDER — BLISTEX MEDICATED EX OINT
TOPICAL_OINTMENT | CUTANEOUS | Status: DC | PRN
  Administered 2016-12-02: 13:00:00 via TOPICAL
  Filled 2016-12-02: qty 6.3

## 2016-12-02 MED ORDER — ENOXAPARIN SODIUM 40 MG/0.4ML ~~LOC~~ SOLN: 40.0000 mg | SUBCUTANEOUS | Status: DC

## 2016-12-02 MED ORDER — TAMSULOSIN HCL 0.4 MG PO CAPS
0.4000 mg | ORAL_CAPSULE | Freq: Every day | ORAL | Status: DC
  Administered 2016-12-02 – 2016-12-07 (×6): 0.4 mg via ORAL
  Filled 2016-12-02 (×7): qty 1

## 2016-12-02 MED ORDER — INSULIN ASPART 100 UNIT/ML ~~LOC~~ SOLN
0.0000 [IU] | SUBCUTANEOUS | Status: DC
  Administered 2016-12-02: 5 [IU] via SUBCUTANEOUS
  Administered 2016-12-02: 9 [IU] via SUBCUTANEOUS
  Filled 2016-12-02: qty 1

## 2016-12-02 NOTE — Progress Notes (Signed)
Nutrition Consult/Brief Note  RD consulted for DM diet education.  Pt is currently in ULTRASOUND.  RD to follow-up at later date.  Maureen Chatters, RD, LDN Pager #: [phone] After-Hours Pager #: [phone]

## 2016-12-02 NOTE — Consult Note (Addendum)
Consult: Sepsis, UTI, right hydronephrosis Requested by: PA Humes/Dr. Pollina  History of Present Illness: 76 year old Porter with history of recurrent UTI over the past 2 years. He presented to the emergency department after his family called EMS due to shaking chills and mental status changes. Urine appears infected and patient had a high fever. He also had tachycardia. CT scan revealed right hydroureteronephrosis down to a distended bladder and a large right bladder diverticulum. Prostate measured about 130 g. He voided about 150 mL into urinal which was clear. I placed a Foley catheter and drained ~1100 mL (although this may have been a couple of hours after he voided). I should add his creatinine was 1.48 which appears to be his baseline.  Patient received vancomycin and Zosyn and has been resuscitated. His lactic acid, heart rate in mental status of all improved.  His 2 children and teacher friend contributed to the history. They also served as interpreter. Patient says he typically voids with a good stream and feels empty. He was referred to our office in 2014 due to an enterococcal UTI. There is a report prior to that of an Escherichia coli UTI. Patient has had hernia surgery but denies any urologic surgery.   Past Medical History:  Diagnosis Date  . GERD (gastroesophageal reflux disease)   . Hyperlipidemia   . Hypertension   . Initial insomnia   . Umbilical hernia    Past Surgical History:  Procedure Laterality Date  . HERNIA REPAIR     2  . UMBILICAL HERNIA REPAIR  11/22/2011   Procedure: HERNIA REPAIR UMBILICAL ADULT;  Surgeon: Liz Malady, MD;  Location: [facility] OR;  Service: General;  Laterality: N/A;    Home Medications:   (Not in a hospital admission) Allergies: No Known Allergies  History reviewed. No pertinent family history. Social History:  reports that he has never smoked. He has never used smokeless tobacco. He reports that he does not drink alcohol or use  drugs.  ROS: A complete review of systems was performed.  All systems are negative except for pertinent findings as noted. Review of Systems  All other systems reviewed and are negative.    Physical Exam:  Vital signs in last Daniel hours: Temp:  [102.1 F (38.9 C)] 102.1 F (38.9 C) (04/19 2301) Pulse Rate:  [96-115] 96 (04/20 0230) Resp:  [14-35] 25 (04/20 0230) BP: (116-170)/(66-125) 121/76 (04/20 0230) SpO2:  [89 %-98 %] 97 % (04/20 0230) General:  Alert and oriented, No acute distress HEENT: Normocephalic, atraumatic Neck: No JVD or lymphadenopathy Cardiovascular: Regular rate and rhythm Lungs: Regular rate and effort Abdomen: Soft, nontender, no abdominal masses; initially his bladder filled distended but decompressed after the Foley was placed Back: No CVA tenderness Extremities: No edema Neurologic: Grossly intact GU: Penis appeared circumcised. Normal foreskin. Testicles descended bilaterally and palpably normal.  I discussed with the patient and his family the nature risks benefits and alternatives to the Foley catheter. Procedure: The penis was prepped and draped in the usual sterile fashion and an 39 [place] coud catheter was placed without difficulty.   Laboratory Data:  Results for orders placed or performed during the hospital encounter of 12/01/16 (from the past Daniel hour(s))  Comprehensive metabolic panel     Status: Abnormal   Collection Time: 12/01/16 11:10 PM  Result Value Ref Range   Sodium 131 (L) 135 - 145 mmol/L   Potassium 4.0 3.5 - 5.1 mmol/L   Chloride 99 (L) 101 - 111 mmol/L  CO2 21 (L) 22 - 32 mmol/L   Glucose, Bld 384 (H) 65 - 99 mg/dL   BUN 27 (H) 6 - 20 mg/dL   Creatinine, Ser 1.61 (H) 0.61 - 1.Daniel mg/dL   Calcium 09.6 8.9 - 04.5 mg/dL   Total Protein 7.1 6.5 - 8.1 g/dL   Albumin 3.5 3.5 - 5.0 g/dL   AST 29 15 - 41 U/L   ALT 32 17 - 63 U/L   Alkaline Phosphatase 124 38 - 126 U/L   Total Bilirubin 1.1 0.3 - 1.2 mg/dL   GFR calc non Af Amer  44 (L) >60 mL/min   GFR calc Af Amer 51 (L) >60 mL/min   Anion gap 11 5 - 15  CBC WITH DIFFERENTIAL     Status: Abnormal   Collection Time: 12/01/16 11:10 PM  Result Value Ref Range   WBC 12.0 (H) 4.0 - 10.5 K/uL   RBC 5.29 4.22 - 5.81 MIL/uL   Hemoglobin 15.1 13.0 - 17.0 g/dL   HCT 40.9 81.1 - 91.4 %   MCV 81.7 78.0 - 100.0 fL   MCH 28.5 26.0 - 34.0 pg   MCHC 35.0 30.0 - 36.0 g/dL   RDW 78.2 95.6 - 21.3 %   Platelets 132 (L) 150 - 400 K/uL   Neutrophils Relative % 89 %   Neutro Abs 10.7 (H) 1.7 - 7.7 K/uL   Lymphocytes Relative 5 %   Lymphs Abs 0.6 (L) 0.7 - 4.0 K/uL   Monocytes Relative 6 %   Monocytes Absolute 0.7 0.1 - 1.0 K/uL   Eosinophils Relative 0 %   Eosinophils Absolute 0.0 0.0 - 0.7 K/uL   Basophils Relative 0 %   Basophils Absolute 0.0 0.0 - 0.1 K/uL  Protime-INR     Status: None   Collection Time: 12/01/16 11:10 PM  Result Value Ref Range   Prothrombin Time 14.1 11.4 - 15.2 seconds   INR 1.08   I-Stat CG4 Lactic Acid, ED  (not at  [facility])     Status: Abnormal   Collection Time: 12/01/16 11:27 PM  Result Value Ref Range   Lactic Acid, Venous 2.05 (HH) 0.5 - 1.9 mmol/L   Comment NOTIFIED PHYSICIAN   I-stat Chem 8, ED     Status: Abnormal   Collection Time: 12/01/16 11:30 PM  Result Value Ref Range   Sodium 134 (L) 135 - 145 mmol/L   Potassium 4.1 3.5 - 5.1 mmol/L   Chloride 99 (L) 101 - 111 mmol/L   BUN 29 (H) 6 - 20 mg/dL   Creatinine, Ser 0.86 (H) 0.61 - 1.Daniel mg/dL   Glucose, Bld 578 (H) 65 - 99 mg/dL   Calcium, Ion 4.69 6.29 - 1.40 mmol/L   TCO2 23 0 - 100 mmol/L   Hemoglobin 15.6 13.0 - 17.0 g/dL   HCT 52.8 41.3 - Daniel.4 %  Urinalysis, Routine w reflex microscopic     Status: Abnormal   Collection Time: 12/01/16 11:38 PM  Result Value Ref Range   Color, Urine YELLOW YELLOW   APPearance CLOUDY (A) CLEAR   Specific Gravity, Urine 1.016 1.005 - 1.030   pH 7.0 5.0 - 8.0   Glucose, UA >=500 (A) NEGATIVE mg/dL   Hgb urine dipstick LARGE (A) NEGATIVE    Bilirubin Urine NEGATIVE NEGATIVE   Ketones, ur NEGATIVE NEGATIVE mg/dL   Protein, ur 30 (A) NEGATIVE mg/dL   Nitrite POSITIVE (A) NEGATIVE   Leukocytes, UA LARGE (A) NEGATIVE   RBC / HPF TOO NUMEROUS  TO COUNT 0 - 5 RBC/hpf   WBC, UA TOO NUMEROUS TO COUNT 0 - 5 WBC/hpf   Bacteria, UA MANY (A) NONE SEEN   Squamous Epithelial / LPF NONE SEEN NONE SEEN  I-Stat CG4 Lactic Acid, ED  (not at  [facility])     Status: None   Collection Time: 12/02/16  1:55 AM  Result Value Ref Range   Lactic Acid, Venous 1.89 0.5 - 1.9 mmol/L   No results found for this or any previous visit (from the past 240 hour(s)). Creatinine:  Recent Labs  12/01/16 2310 12/01/16 2330  CREATININE 1.48* 1.40*    Impression/Assessment/Plan:  1 right hydronephrosis-this was likely due to bladder and right bladder diverticulum distention. Will repeat renal ultrasound.  2 BPH-I discussed with the patient and his family the nature risks benefits and alternatives to tamsulosin and finasteride. We discussed FDA warnings on finasteride. He elected to start.  3 bladder diverticulum-decompressed with Foley catheter. Voiding trial once stable. He can follow-up in the office for a post void residual check in a few weeks.  4 sepsis, UTI - already improving. Antibiotics per primary team. I do think vancomycin and Zosyn are good options given his prior cultures until these cultures come back. Discussed with Dr. Julian Reil.  Maecy Podgurski 12/02/2016, 3:31 AM

## 2016-12-02 NOTE — Progress Notes (Signed)
   I review renal u/s - it appears the right hydro has mostly resolved with bladder decompression. Also the right bladder diverticulum has decompressed (it was 11 x 8 x 12 on CT and 8 x 5 x 6 on the [place]). Void trial/foley removal prior to discharge would be appropriate. He can f/u with me for cystoscopy and repeat renal u/s in outpatient.

## 2016-12-02 NOTE — Progress Notes (Signed)
  PHARMACY - PHYSICIAN COMMUNICATION CRITICAL VALUE ALERT - BLOOD CULTURE IDENTIFICATION (BCID)  Results for orders placed or performed during the hospital encounter of 12/01/16  Blood Culture ID Panel (Reflexed) (Collected: 12/01/2016 11:14 PM)  Result Value Ref Range   Enterococcus species NOT DETECTED NOT DETECTED   Listeria monocytogenes NOT DETECTED NOT DETECTED   Staphylococcus species DETECTED (A) NOT DETECTED   Staphylococcus aureus NOT DETECTED NOT DETECTED   Methicillin resistance NOT DETECTED NOT DETECTED   Streptococcus species NOT DETECTED NOT DETECTED   Streptococcus agalactiae NOT DETECTED NOT DETECTED   Streptococcus pneumoniae NOT DETECTED NOT DETECTED   Streptococcus pyogenes NOT DETECTED NOT DETECTED   Acinetobacter baumannii NOT DETECTED NOT DETECTED   Enterobacteriaceae species NOT DETECTED NOT DETECTED   Enterobacter cloacae complex NOT DETECTED NOT DETECTED   Escherichia coli NOT DETECTED NOT DETECTED   Klebsiella oxytoca NOT DETECTED NOT DETECTED   Klebsiella pneumoniae NOT DETECTED NOT DETECTED   Proteus species NOT DETECTED NOT DETECTED   Serratia marcescens NOT DETECTED NOT DETECTED   Haemophilus influenzae NOT DETECTED NOT DETECTED   Neisseria meningitidis NOT DETECTED NOT DETECTED   Pseudomonas aeruginosa NOT DETECTED NOT DETECTED   Candida albicans NOT DETECTED NOT DETECTED   Candida glabrata NOT DETECTED NOT DETECTED   Candida krusei NOT DETECTED NOT DETECTED   Candida parapsilosis NOT DETECTED NOT DETECTED   Candida tropicalis NOT DETECTED NOT DETECTED    Name of physician (or Provider) Contacted: Dr. Antionette Char  Changes to prescribed antibiotics required: Keep Vancomycin and Zosyn for now with other cultures pending and history of enterococcus/Ecoli UTIs in past. F/up for ablility to narrow.   Fayne Norrie 12/02/2016  8:32 PM

## 2016-12-02 NOTE — Progress Notes (Addendum)
Inpatient Diabetes Program Recommendations  AACE/ADA: New Consensus Statement on Inpatient Glycemic Control (2015)  Target Ranges:  Prepandial:   less than 140 mg/dL      Peak postprandial:   less than 180 mg/dL (1-2 hours)      Critically ill patients:  140 - 180 mg/dL   Results for Daniel Porter, Daniel Porter (MRN [id number]) as of 12/02/2016 15:06  Ref. Range 12/02/2016 03:52 12/02/2016 05:14 12/02/2016 08:08 12/02/2016 11:53  Glucose-Capillary Latest Ref Range: 65 - 99 mg/dL 409 (H) 811 (H) 914 (H) 271 (H)    Admit with: UTI/ Sepsis  History: DM2  Home DM Meds: None  Current Insulin Orders: Lantus 10 units daily      Novolog Sensitive Correction Scale/ SSI (0-9 units) TID AC + HS       -Note Lantus and Novolog started this AM.  Also note Hemoglobin A1c currently in process.  Expect A1c to be elevated given the fact that pt's CBGs have been so high here in the hospital.  -Ordered Living Well with Diabetes booklet and RD consult for diabetes diet education for patient.  -Spoke with patient today with his daughter and son present at bedside.  Patient speaks minimal English (native Arabic speaker).  Patient rambled on and on about how he knows how to care of his diabetes.  Stated to me that when his sugars are elevated that he eats mostly "lettuce" and his sugars come down normally.  States he checks his CBGs BID and that they are "fine".    -Explained to patient about the importance of checking CBGs at home and the importance of maintaining good CBG control to prevent complications like blindness, kidney disease, stroke, and heart attack.  When I mentioned the complication of heart disease, the patient told me his heart was "strong" and that he didn't have any troubles with his heart.  I again tried to explain to pt that he could develop heart disease from uncontrolled CBGs but I do not think that patient was open to anything I had to say.  I attempted to get pt's daughter and son to translate the  above information, however, I am not sure they understood the importance of pt's condition.  -Patient went on to tell me that he is both the "mother" and "father" of his children (his wife is deceased) and that he is both his own doctor and the doctor of his children.  Stated he knows his body and knows what to do to take care of himself.  I encouraged pt to avoid all beverages with sugar.  Patient told me he will not drink beverages with sugar, but then told his nurse (while I was in the room) that he won't drink water and only wants to drink juice.  The nurse encouraged him to not drink juice but I am unsure how compliant he will be.  Have ordered dietician consult for diabetes diet education reinforcement.  -Gave Diabetes education booklet to pt's daughter (she is able to speak and read [place]).  Asked daughter to please read through the book and ask RN if she has any questions about any of the concepts covered in the book.  -Have asked RNs caring for pt to continue to reinforce basic diabetes education concepts with patient.    --Will follow patient during hospitalization--  Ambrose Finland RN, MSN, CDE Diabetes Coordinator Inpatient Glycemic Control Team Team Pager: [phone] (8a-5p)

## 2016-12-02 NOTE — H&P (Addendum)
History and Physical    Daniel Porter:[id number] DOB: 1940-12-01 DOA: 12/01/2016  PCP: No PCP Per Patient  Patient coming from: Home  I have personally briefly reviewed patient's old medical records in [facility]  Chief Complaint: AMS  HPI: Daniel Porter is a 76 y.o. male with medical history significant of recurrent UTIs over the past 2 years.  He presents to the ED after his family called EMS due to rigors and mental status changes.  Symptoms onset rapidly earlier in the evening, worsening, nothing makes better or worse.   ED Course: In the ED he was septic with tachycardia, fever 102.1, initial lactate 2.05 but corrects with IVR bolus.  Creat 1.4.  Patient started on zosyn and vanc.  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 Mena Goes.  Mental status now much improved per family.   Review of Systems: As per HPI otherwise 10 point review of systems negative.   Past Medical History:  Diagnosis Date  . GERD (gastroesophageal reflux disease)   . Hyperlipidemia   . Hypertension   . Initial insomnia   . Umbilical hernia     Past Surgical History:  Procedure Laterality Date  . HERNIA REPAIR     2  . UMBILICAL HERNIA REPAIR  11/22/2011   Procedure: HERNIA REPAIR UMBILICAL ADULT;  Surgeon: Liz Malady, MD;  Location: [facility];  Service: General;  Laterality: N/A;     reports that he has never smoked. He has never used smokeless tobacco. He reports that he does not drink alcohol or use drugs.  No Known Allergies  History reviewed. No pertinent family history. No recent illness in family.  Prior to Admission medications   Medication Sig Start Date End Date Taking? Authorizing Provider  PRESCRIPTION MEDICATION Apply topically daily. Face Cream   Yes Historical Provider, MD  artificial tears (LACRILUBE) OINT ophthalmic ointment Place into the  right eye every 3 (three) hours as needed for dry eyes. Patient not taking: Reported on 12/02/2016 11/21/16   Hayden Rasmussen, NP  predniSONE (DELTASONE) 20 MG tablet Take 3 tabs po daily for 7 days Patient not taking: Reported on 12/02/2016 11/21/16   Hayden Rasmussen, NP  valACYclovir (VALTREX) 1000 MG tablet Take 1 tablet (1,000 mg total) by mouth 3 (three) times daily. Patient not taking: Reported on 12/02/2016 11/21/16   Hayden Rasmussen, NP    Physical Exam: Vitals:   12/02/16 0145 12/02/16 0200 12/02/16 0215 12/02/16 0230  BP: 120/72 119/80 123/66 121/76  Pulse: 100 99 96 96  Resp: (!) 21 (!) 26 (!) 21 (!) 25  Temp:      TempSrc:      SpO2: 96% 95% 96% 97%    Constitutional: NAD, calm, comfortable Eyes: PERRL, lids and conjunctivae normal ENMT: Mucous membranes are moist. Posterior pharynx clear of any exudate or lesions.Normal dentition.  Neck: normal, supple, no masses, no thyromegaly Respiratory: clear to auscultation bilaterally, no wheezing, no crackles. Normal respiratory effort. No accessory muscle use.  Cardiovascular: Regular rate and rhythm, no murmurs / rubs / gallops. No extremity edema. 2+ pedal pulses. No carotid bruits.  Abdomen: no tenderness, no masses palpated. No hepatosplenomegaly. Bowel sounds positive.  Musculoskeletal: no clubbing / cyanosis. No joint deformity upper and lower extremities. Good ROM, no contractures. Normal muscle tone.  Skin: no rashes, lesions, ulcers. No induration Neurologic: CN 2-12 grossly intact. Sensation  intact, DTR normal. Strength 5/5 in all 4.  Psychiatric: Normal judgment and insight. Alert and oriented x 3. Normal mood.    Labs on Admission: I have personally reviewed following labs and imaging studies  CBC:  Recent Labs Lab 12/01/16 2310 12/01/16 2330  WBC 12.0*  --   NEUTROABS 10.7*  --   HGB 15.1 15.6  HCT 43.2 46.0  MCV 81.7  --   PLT 132*  --    Basic Metabolic Panel:  Recent Labs Lab 12/01/16 2310 12/01/16 2330  NA 131* 134*   K 4.0 4.1  CL 99* 99*  CO2 21*  --   GLUCOSE 384* 395*  BUN 27* 29*  CREATININE 1.48* 1.40*  CALCIUM 10.0  --    GFR: CrCl cannot be calculated (Unknown ideal weight.). Liver Function Tests:  Recent Labs Lab 12/01/16 2310  AST 29  ALT 32  ALKPHOS 124  BILITOT 1.1  PROT 7.1  ALBUMIN 3.5   No results for input(s): LIPASE, AMYLASE in the last 168 hours. No results for input(s): AMMONIA in the last 168 hours. Coagulation Profile:  Recent Labs Lab 12/01/16 2310  INR 1.08   Cardiac Enzymes: No results for input(s): CKTOTAL, CKMB, CKMBINDEX, TROPONINI in the last 168 hours. BNP (last 3 results) No results for input(s): PROBNP in the last 8760 hours. HbA1C: No results for input(s): HGBA1C in the last 72 hours. CBG:  Recent Labs Lab 12/02/16 0352  GLUCAP 411*   Lipid Profile: No results for input(s): CHOL, HDL, LDLCALC, TRIG, CHOLHDL, LDLDIRECT in the last 72 hours. Thyroid Function Tests: No results for input(s): TSH, T4TOTAL, FREET4, T3FREE, THYROIDAB in the last 72 hours. Anemia Panel: No results for input(s): VITAMINB12, FOLATE, FERRITIN, TIBC, IRON, RETICCTPCT in the last 72 hours. Urine analysis:    Component Value Date/Time   COLORURINE YELLOW 12/01/2016 2338   APPEARANCEUR CLOUDY (A) 12/01/2016 2338   LABSPEC 1.016 12/01/2016 2338   PHURINE 7.0 12/01/2016 2338   GLUCOSEU >=500 (A) 12/01/2016 2338   HGBUR LARGE (A) 12/01/2016 2338   BILIRUBINUR NEGATIVE 12/01/2016 2338   KETONESUR NEGATIVE 12/01/2016 2338   PROTEINUR 30 (A) 12/01/2016 2338   UROBILINOGEN 0.2 09/17/2014 1825   NITRITE POSITIVE (A) 12/01/2016 2338   LEUKOCYTESUR LARGE (A) 12/01/2016 2338    Radiological Exams on Admission: Dg Chest Port 1 View  Result Date: 12/01/2016 CLINICAL DATA:  Altered mental status, onset today.  Fever. EXAM: PORTABLE CHEST 1 VIEW COMPARISON:  11/16/2011 FINDINGS: Shallow inspiration. Linear atelectasis in the lung bases. Heart size and pulmonary vascularity  are normal for technique. No focal consolidation. No blunting of costophrenic angles. No pneumothorax. Mediastinal contours appear intact. Degenerative changes in the spine. IMPRESSION: Shallow inspiration with linear atelectasis in the lung bases. Electronically Signed   By: Burman Nieves M.D.   On: 12/01/2016 23:41   Ct Renal Stone Study  Result Date: 12/02/2016 CLINICAL DATA:  Hematuria and sepsis.  Combative. EXAM: CT ABDOMEN AND PELVIS WITHOUT CONTRAST TECHNIQUE: Multidetector CT imaging of the abdomen and pelvis was performed following the standard protocol without IV contrast. COMPARISON:  None. FINDINGS: Lower chest: Atelectasis in the lung bases. Motion artifact limits evaluation. Coronary artery calcifications. Small esophageal hiatal hernia. Hepatobiliary: No focal liver abnormality is seen. No gallstones, gallbladder wall thickening, or biliary dilatation. Pancreas: Unremarkable. No pancreatic ductal dilatation or surrounding inflammatory changes. Spleen: Spleen is mildly enlarged.  No focal lesions. Adrenals/Urinary Tract: No adrenal gland nodules. There is right hydronephrosis and hydroureter down to the  level of the bladder. No obstructing stones are visualized. In the right pelvis adjacent to the bladder and displacing the bladder towards the left, there is a large cystic mass lesion. The lesion measures 8.3 x 10.9 cm. The mass displaces the right ureter towards the left. The ureter extends between the mass in the bladder. 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 This could possibly represent a large ureterocele. More likely, this represents a mesenteric cyst or other cystic mass. The bladder wall is not thickened and no bladder filling defects are demonstrated. No hydronephrosis of the left kidney. Parenchymal atrophy involving both kidneys. Small exophytic mass lesions arising from both  kidneys are incompletely characterized on noncontrast imaging but likely represent cysts. The bladder is mildly distended suggesting possible bladder outlet obstruction. Stomach/Bowel: There is a moderate size right inguinal hernia containing a portion of the sigmoid colon. No proximal obstruction. There is adjacent fat herniation in the right inguinal region possibly representing a femoral hernia. There is a ventral anterior abdominal wall hernia to the left of midline containing small bowel. Herniated bowel are decompressed. No evidence of proximal obstruction. The appendix is normal. Stomach, small bowel, and colon are mostly decompressed without abnormal distention. Vascular/Lymphatic: Aortic atherosclerosis. No enlarged abdominal or pelvic lymph nodes. Reproductive: Prostate gland is enlarged, measuring 6.5 cm diameter. Other: No free air or free fluid in the abdomen. Musculoskeletal: Degenerative changes in the spine. No destructive bone lesions. IMPRESSION: 1. Large cystic mass in the right pelvis abutting the bladder. This may be causing extrinsic compression of the right ureter. There is right-sided hydronephrosis and hydroureter without a stone identified. Etiology of the mass is uncertain. Could represent a bladder diverticulum all of the bladder wall appears to be continuous. The could represent a large ureterocele. It could represent a mesenteric mass or other cystic mass. Consider further evaluation with MRI. 2. Right inguinal hernia containing sigmoid colon without proximal obstruction. Left anterior ventral abdominal wall hernia containing small bowel without proximal obstruction. 3. Prominent enlargement of the prostate gland with moderate bladder distention possibly indicating outlet obstruction. 4. Small bilateral renal cysts. Bilateral renal parenchymal atrophy. Small esophageal hiatal hernia. Electronically Signed   By: Burman Nieves M.D.   On: 12/02/2016 01:10    EKG: Independently  reviewed.  Assessment/Plan Principal Problem:   Pyohydronephrosis Active Problems:   DM2 (diabetes mellitus, type 2) (HCC)   Sepsis secondary to UTI (HCC)   Bladder mass   CKD (chronic kidney disease) stage 3, GFR 30-59 ml/min    1. Pyohydronephrosis - this and UTI causing sepsis, symptomsalleviated by presence of foley catheter which has emptied th e the bladder. 2. Sepsis secondary to UTI - 1. Zosyn and continue vanc per Dr. Mena Goes, at least until cultures are back 2. Patient with history of E.Coli and enterococcus causing past UTIs. 3. CKD stage 3 - appears chronic and stable 4. DM2 - appears to be a new diagnosis 1. Sensitive scale SSI Q4H for now 5. 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

## 2016-12-02 NOTE — ED Notes (Signed)
CBG of 411 reported to Julian Reil MD. York Spaniel to give 9 units of insulin and recheck in an hour.  Will continue to monitor patient.

## 2016-12-02 NOTE — ED Notes (Signed)
Patient is stable and ready to be transport to the floor at this time.  Report was called to 4E RN.  Belongings taken with the patient to the floor.   

## 2016-12-02 NOTE — ED Notes (Signed)
Urology at bedside to place catheter

## 2016-12-02 NOTE — Progress Notes (Signed)
PROGRESS NOTE   GLADE STRAUSSER  ZOX:[id number]    DOB: 1941/04/09    DOA: 12/01/2016  PCP: No PCP Per Patient   I have briefly reviewed patients previous medical records in [facility].  Brief Narrative:  76 y/o widowed male, Arabic speaking, does not see a PCP, PMH of untreated DM, GERD, HTN, recent R Bells palsy and on Prednisone & Valtrex, recurrent UIT's in the past 2 years, presented to the ED after his family called EMS d/t shaking chills and AMS. Septic on admission. Urology consulted and S/P Foley for R hydronephrosis due to bladder & R bladder diverticulum distension. Admitted to Step down.   Assessment & Plan:   Principal Problem:   Pyohydronephrosis Active Problems:   DM2 (diabetes mellitus, type 2) (HCC)   Sepsis secondary to UTI (HCC)   Bladder mass   CKD (chronic kidney disease) stage 3, GFR 30-59 ml/min   1. Sepsis d/t complicated UTI: Treated per sepsis protocol with aggressive IV fluids and IV Vancomycin & Zosyn (Prior enterococcal & E Coli UTI). Sepsis physiology resolved. Follow cultures. 2. Right Hydronephrosis/hydroureter: likely due to bladder and right bladder diverticulum distention. Urology consulted> FOley catheter placed and 1100 ml urine drained initially. Repeating Renal US. 3. 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 Per Urology > voiding trail once stable. OP Urology follow up in a few weeks. 4. Acute encephalopathy: Likely related to sepsis. No new focal deficits. Improved or even resolved. 5. Thrombocytopenia:? Related to sepsis. Follow CBCs. No bleeding reported. 6. Hyponatremia: Due to dehydration and hyperglycemia. IV normal saline. Follow BMP. 7. Stage 3 CKD: Creatinine stable. Follow BMP's. 8. Uncontrolled/untreated DM2: stated home CBG's ranged from 180-400 and drinks sweat drinks nightly. Also precipitated by recent steroids.Diabetes coordinator consultation. Check hemoglobin A1c. Start  Lantus 10 units subcutaneously daily and continue SSI. Monitor closely. 9. Essential hypertension: Mildly uncontrolled. 10. Recent right-sided Bell's palsy: Apparently occurred 2 weeks ago.   DVT prophylaxis: Was on Lovenox (patient declines due to pork derivatives). Pharmacy consulted for alternate options. Code Status: Full Family Communication: Discussed in detail with patient's teenage daughter and a son at bedside. Disposition: DC home when medically stable.   Consultants:  Urology   Procedures:  Foley catheter  Antimicrobials:  IV Zosyn and vancomycin    Subjective: Complaints of discomfort/pain from Foley catheter, headache. Overall feels better compared to last night. Confusion seems to have resolved. Patient's teenage daughter at bedside assisted with interpretation.   ROS: No chest pain or dyspnea.   Objective:  Vitals:   12/02/16 0500 12/02/16 0530 12/02/16 0635 12/02/16 0800  BP: 118/72 122/77 (!) 127/96 (!) 141/87  Pulse: 84 85 82 79  Resp:   15 (!) 26  Temp:   98.1 F (36.7 C) 97.7 F (36.5 C)  TempSrc:   Oral Oral  SpO2: 97% 96% 97% 100%  Weight:   89.3 kg (196 lb 13.9 oz)   Height:    (1.803 m)     Examination:  General exam: Pleasant elderly male, moderately built and nourished lying comfortably propped up in bed. Does not look septic or toxic currently. Respiratory system: Clear to auscultation. Respiratory effort normal. Cardiovascular system: S1 & S2 heard, RRR. No JVD, murmurs, rubs, gallops or clicks. No pedal edema. Telemetry: Sinus rhythm. Gastrointestinal system: Abdomen is nondistended, soft and nontender. No organomegaly or masses felt. Normal bowel sounds heard. Foley catheter +. Subumbilical reducible uncomplicated ventral hernia. Central nervous system: Alert and  oriented. Old LMN facial palsy/Bell's palsy Extremities: Symmetric 5 x 5 power. Skin: No rashes, lesions or ulcers Psychiatry: Judgement and insight appear normal. Mood &  affect appropriate.     Data Reviewed: I have personally reviewed following labs and imaging studies  CBC:  Recent Labs Lab 12/01/16 2310 12/01/16 2330  WBC 12.0*  --   NEUTROABS 10.7*  --   HGB 15.1 15.6  HCT 43.2 46.0  MCV 81.7  --   PLT 132*  --    Basic Metabolic Panel:  Recent Labs Lab 12/01/16 2310 12/01/16 2330  NA 131* 134*  K 4.0 4.1  CL 99* 99*  CO2 21*  --   GLUCOSE 384* 395*  BUN 27* 29*  CREATININE 1.48* 1.40*  CALCIUM 10.0  --    Liver Function Tests:  Recent Labs Lab 12/01/16 2310  AST 29  ALT 32  ALKPHOS 124  BILITOT 1.1  PROT 7.1  ALBUMIN 3.5   Coagulation Profile:  Recent Labs Lab 12/01/16 2310  INR 1.08   CBG:  Recent Labs Lab 12/02/16 0352 12/02/16 0514 12/02/16 0808  GLUCAP 411* 370* 285*    Recent Results (from the past 240 hour(s))  MRSA PCR Screening     Status: None   Collection Time: 12/02/16  6:36 AM  Result Value Ref Range Status   MRSA by PCR NEGATIVE NEGATIVE Final    Comment:        The GeneXpert MRSA Assay (FDA approved for NASAL specimens only), is one component of a comprehensive MRSA colonization surveillance program. It is not intended to diagnose MRSA infection nor to guide or monitor treatment for MRSA infections.          Radiology Studies: Dg Chest Port 1 View  Result Date: 12/01/2016 CLINICAL DATA:  Altered mental status, onset today.  Fever. EXAM: PORTABLE CHEST 1 VIEW COMPARISON:  11/16/2011 FINDINGS: Shallow inspiration. Linear atelectasis in the lung bases. Heart size and pulmonary vascularity are normal for technique. No focal consolidation. No blunting of costophrenic angles. No pneumothorax. Mediastinal contours appear intact. Degenerative changes in the spine. IMPRESSION: Shallow inspiration with linear atelectasis in the lung bases. Electronically Signed   By: Burman Nieves M.D.   On: 12/01/2016 23:41   Ct Renal Stone Study  Result Date: 12/02/2016 CLINICAL DATA:  Hematuria  and sepsis.  Combative. EXAM: CT ABDOMEN AND PELVIS WITHOUT CONTRAST TECHNIQUE: Multidetector CT imaging of the abdomen and pelvis was performed following the standard protocol without IV contrast. COMPARISON:  None. FINDINGS: Lower chest: Atelectasis in the lung bases. Motion artifact limits evaluation. Coronary artery calcifications. Small esophageal hiatal hernia. Hepatobiliary: No focal liver abnormality is seen. No gallstones, gallbladder wall thickening, or biliary dilatation. Pancreas: Unremarkable. No pancreatic ductal dilatation or surrounding inflammatory changes. Spleen: Spleen is mildly enlarged.  No focal lesions. Adrenals/Urinary Tract: No adrenal gland nodules. There is right hydronephrosis and hydroureter down to the level of the bladder. No obstructing stones are visualized. In the right pelvis adjacent to the bladder and displacing the bladder towards the left, there is a large cystic mass lesion. The lesion measures 8.3 x 10.9 cm. The mass displaces the right ureter towards the left. The ureter extends between the mass in the bladder. 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 This could possibly represent a large ureterocele. More likely, this represents a mesenteric cyst or other cystic mass. The bladder  wall is not thickened and no bladder filling defects are demonstrated. No hydronephrosis of the left kidney. Parenchymal atrophy involving both kidneys. Small exophytic mass lesions arising from both kidneys are incompletely characterized on noncontrast imaging but likely represent cysts. The bladder is mildly distended suggesting possible bladder outlet obstruction. Stomach/Bowel: There is a moderate size right inguinal hernia containing a portion of the sigmoid colon. No proximal obstruction. There is adjacent fat herniation in the right inguinal region possibly representing a femoral  hernia. There is a ventral anterior abdominal wall hernia to the left of midline containing small bowel. Herniated bowel are decompressed. No evidence of proximal obstruction. The appendix is normal. Stomach, small bowel, and colon are mostly decompressed without abnormal distention. Vascular/Lymphatic: Aortic atherosclerosis. No enlarged abdominal or pelvic lymph nodes. Reproductive: Prostate gland is enlarged, measuring 6.5 cm diameter. Other: No free air or free fluid in the abdomen. Musculoskeletal: Degenerative changes in the spine. No destructive bone lesions. IMPRESSION: 1. Large cystic mass in the right pelvis abutting the bladder. This may be causing extrinsic compression of the right ureter. There is right-sided hydronephrosis and hydroureter without a stone identified. Etiology of the mass is uncertain. Could represent a bladder diverticulum all of the bladder wall appears to be continuous. The could represent a large ureterocele. It could represent a mesenteric mass or other cystic mass. Consider further evaluation with MRI. 2. Right inguinal hernia containing sigmoid colon without proximal obstruction. Left anterior ventral abdominal wall hernia containing small bowel without proximal obstruction. 3. Prominent enlargement of the prostate gland with moderate bladder distention possibly indicating outlet obstruction. 4. Small bilateral renal cysts. Bilateral renal parenchymal atrophy. Small esophageal hiatal hernia. Electronically Signed   By: Burman Nieves M.D.   On: 12/02/2016 01:10        Scheduled Meds: . enoxaparin (LOVENOX) injection  40 mg Subcutaneous Q24H  . finasteride  5 mg Oral Daily  . insulin aspart  0-9 Units Subcutaneous Q4H  . living well with diabetes book   Does not apply Once  . sodium chloride flush  3 mL Intravenous Q12H  . tamsulosin  0.4 mg Oral QPC supper   Continuous Infusions: . piperacillin-tazobactam (ZOSYN)  IV 3.375 g (12/02/16 0808)  . vancomycin        LOS: 0 days     Warrick Llera, MD, FACP, FHM. [facility] Pager [phone]  If 7PM-7AM, please contact night-coverage [email or web address] Password [email or web address] 12/02/2016, 11:35 AM

## 2016-12-02 NOTE — Progress Notes (Signed)
Chart and images reviewed -- full consult to follow. Sepsis, UTI, right hydronephrosis, BPH (130g prostate), large right bladder diverticulum and distended bladder  -- recommend foley catheter to decompress bladder, diverticulum. No stone, so right hydro likely from bladder and diverticulum distention. Pt referred to [place] in 2014 with enterococcus on urine cx sensitive to vanc, FQ's (from Dr. Fredrik Cove), but he did not keep appt. He's on Vanc, Zosyn which is excellent GU coverage.

## 2016-12-02 NOTE — Care Management Note (Signed)
Case Management Note  Patient Details  Name: Daniel Porter MRN: [id number] Date of Birth: 1941/04/03  Subjective/Objective:    From home with family, presents with sepsis, right hydronephrosis, acute urinary retention, acute encephalopathy, thrombocytopenia, hyponatremia, stage 3 ckd, uncontrolled dm2, htn, recent right side bells palsy.               Action/Plan: NCM will follow for dc needs.  Expected Discharge Date:                  Expected Discharge Plan:  Home/Self Care  In-House Referral:     Discharge planning Services  CM Consult  Post Acute Care Choice:    Choice offered to:     DME Arranged:    DME Agency:     HH Arranged:    HH Agency:     Status of Service:  In process, will continue to follow  If discussed at Long Length of Stay Meetings, dates discussed:    Additional Comments:  Leone Haven, RN 12/02/2016, 5:13 PM

## 2016-12-02 NOTE — Progress Notes (Signed)
Pharmacy Antibiotic Note  Daniel Porter is a 75 y.o. male admitted on 12/01/2016 with sepsis.  Pharmacy has been consulted for Vancomycin and Zosyn dosing.  Vanc 1.5gm and Zosyn 3.375gm given in ED ~midnight  Plan: Zosyn 3.375gm IV q8h - doses over 4 hours Vancomycin  IV q12h Will f/u micro data, renal function, and pt's clinical condition Vanc trough prn      Temp (24hrs), Avg:102.1 F (38.9 C), Min:102.1 F (38.9 C), Max:102.1 F (38.9 C)   Recent Labs Lab 12/01/16 2310 12/01/16 2327 12/01/16 2330 12/02/16 0155  WBC 12.0*  --   --   --   CREATININE 1.48*  --  1.40*  --   LATICACIDVEN  --  2.05*  --  1.89    CrCl cannot be calculated (Unknown ideal weight.).    No Known Allergies  Antimicrobials this admission: 4/20 Vanc >>  4/20 Zosyn >>   Dose adjustments this admission: n/a  Microbiology results: 4/19 BCx x2:  4/19 UCx:    Thank you for allowing pharmacy to be a part of this patient's care.  Christoper Fabian, PharmD, BCPS Clinical pharmacist, pager [phone] 12/02/2016 2:50 AM

## 2016-12-03 DIAGNOSIS — A419 Sepsis, unspecified organism: Secondary | ICD-10-CM

## 2016-12-03 DIAGNOSIS — R7881 Bacteremia: Secondary | ICD-10-CM | POA: Diagnosis not present

## 2016-12-03 DIAGNOSIS — N1330 Unspecified hydronephrosis: Secondary | ICD-10-CM | POA: Diagnosis not present

## 2016-12-03 DIAGNOSIS — N390 Urinary tract infection, site not specified: Secondary | ICD-10-CM

## 2016-12-03 DIAGNOSIS — N183 Chronic kidney disease, stage 3 (moderate): Secondary | ICD-10-CM | POA: Diagnosis not present

## 2016-12-03 DIAGNOSIS — E1122 Type 2 diabetes mellitus with diabetic chronic kidney disease: Secondary | ICD-10-CM | POA: Diagnosis not present

## 2016-12-03 DIAGNOSIS — N3001 Acute cystitis with hematuria: Secondary | ICD-10-CM | POA: Diagnosis not present

## 2016-12-03 DIAGNOSIS — A411 Sepsis due to other specified staphylococcus: Secondary | ICD-10-CM | POA: Diagnosis not present

## 2016-12-03 LAB — BASIC METABOLIC PANEL
Anion gap: 6 (ref 5–15)
BUN: 21 mg/dL — ABNORMAL HIGH (ref 6–20)
CHLORIDE: 108 mmol/L (ref 101–111)
CO2: 21 mmol/L — AB (ref 22–32)
Calcium: 9.1 mg/dL (ref 8.9–10.3)
Creatinine, Ser: 1.32 mg/dL — ABNORMAL HIGH (ref 0.61–1.24)
GFR calc Af Amer: 59 mL/min — ABNORMAL LOW (ref >60)
GFR calc non Af Amer: 51 mL/min — ABNORMAL LOW (ref >60)
GLUCOSE: 225 mg/dL — AB (ref 65–99)
Potassium: 4.6 mmol/L (ref 3.5–5.1)
Sodium: 135 mmol/L (ref 135–145)

## 2016-12-03 LAB — GLUCOSE, CAPILLARY
GLUCOSE-CAPILLARY: 184 mg/dL — AB (ref 65–99)
GLUCOSE-CAPILLARY: 226 mg/dL — AB (ref 65–99)
GLUCOSE-CAPILLARY: 200 mg/dL — AB (ref 65–99)
Glucose-Capillary: 224 mg/dL — ABNORMAL HIGH (ref 65–99)
Glucose-Capillary: 224 mg/dL — ABNORMAL HIGH (ref 65–99)
Glucose-Capillary: 266 mg/dL — ABNORMAL HIGH (ref 65–99)

## 2016-12-03 LAB — CBC
HEMATOCRIT: 38.5 % — AB (ref 39.0–52.0)
HEMOGLOBIN: 12.8 g/dL — AB (ref 13.0–17.0)
MCH: 27.8 pg (ref 26.0–34.0)
MCHC: 33.2 g/dL (ref 30.0–36.0)
MCV: 83.5 fL (ref 78.0–100.0)
Platelets: 128 10*3/uL — ABNORMAL LOW (ref 150–400)
RBC: 4.61 MIL/uL (ref 4.22–5.81)
RDW: 13.9 % (ref 11.5–15.5)
WBC: 6.5 10*3/uL (ref 4.0–10.5)

## 2016-12-03 LAB — HEMOGLOBIN A1C
HEMOGLOBIN A1C: 10.2 % — AB (ref 4.8–5.6)
MEAN PLASMA GLUCOSE: 246 mg/dL

## 2016-12-03 MED ORDER — INSULIN GLARGINE 100 UNIT/ML ~~LOC~~ SOLN
15.0000 [IU] | Freq: Every day | SUBCUTANEOUS | Status: DC
  Administered 2016-12-03 – 2016-12-07 (×5): 15 [IU] via SUBCUTANEOUS
  Filled 2016-12-03 (×5): qty 0.15

## 2016-12-03 MED ORDER — HYDRALAZINE HCL 20 MG/ML IJ SOLN
5.0000 mg | Freq: Three times a day (TID) | INTRAMUSCULAR | Status: DC | PRN
Start: 2016-12-03 — End: 2016-12-08
  Administered 2016-12-03: 5 mg via INTRAVENOUS
  Filled 2016-12-03: qty 1

## 2016-12-03 NOTE — Progress Notes (Signed)
PROGRESS NOTE   Daniel Porter  WUJ:[id number]    DOB: 08-Dec-1940    DOA: 12/01/2016  PCP: No PCP Per Patient   I have briefly reviewed patients previous medical records in [facility].  Brief Narrative:  76 y/o widowed male, Arabic speaking, does not see a PCP, PMH of untreated DM, GERD, HTN, recent R Bells palsy and on Prednisone & Valtrex, recurrent UIT's in the past 2 years, presented to the ED after his family called EMS d/t shaking chills and AMS. Septic on admission. Urology consulted and S/P Foley for R hydronephrosis due to bladder & R bladder diverticulum distension. Admitted to Step down. Staphylococcus bacteremia confirmed, sensitivities pending. Urine culture >100 K unidentified organism.   Assessment & Plan:   Principal Problem:   Pyohydronephrosis Active Problems:   DM2 (diabetes mellitus, type 2) (HCC)   Sepsis secondary to UTI (HCC)   Bladder mass   CKD (chronic kidney disease) stage 3, GFR 30-59 ml/min   1. Sepsis d/t complicated UTI/Staphylococcus bacteremia: Treated per sepsis protocol with aggressive IV fluids and IV Vancomycin & Zosyn (Prior enterococcal & E Coli UTI). Sepsis physiology resolved. Staphylococcus bacteremia confirmed, sensitivities pending. Urine culture >100 K unidentified organism. Continue current antibiotics and Follow sensitivities. 2. Right Hydronephrosis/hydroureter: likely due to bladder and right bladder diverticulum distention. Urology consulted> Foley catheter placed and 1100 ml urine drained initially. 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 Recommending voiding trial/Foley catheter removal prior to discharge and outpatient follow-up with Dr Mena Goes for cystoscopy and repeat renal ultrasound as outpatient. Voiding trial 4/22 . 3. 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 Per Urology >  voiding trail once stable. OP Urology follow up in a few weeks. 4. Acute encephalopathy: Likely related to sepsis. No new focal deficits. Resolved. 5. Thrombocytopenia:? Related to sepsis. Follow CBCs. No bleeding reported. Stable. 6. Hyponatremia: Due to dehydration and hyperglycemia. Resolved 7. Stage 3 CKD: Creatinine stable. Follow BMP's. 8. Uncontrolled/untreated DM2: stated home CBG's ranged from 180-400 and drinks sweat drinks nightly. Also precipitated by recent steroids.Diabetes coordinator consultation appreciated and discussed with them 4/20. Check hemoglobin A1c: 10.2. Increase Lantus 15 units subcutaneously daily and continue SSI. Monitor closely. If patient complies, would ideally DC home on Insulins> discussed with family 9. Essential hypertension: Mildly uncontrolled. 10. Recent right-sided Bell's palsy: Apparently occurred 2 weeks ago. Didn't fill one of the meds ? antiviral   DVT prophylaxis: Arixtra Code Status: Full Family Communication: Discussed in detail with patient's teenage daughter and son at bedside. Disposition: DC home when medically stable. Transfer to medical bed 4/21   Consultants:  Urology   Procedures:  Foley catheter  Antimicrobials:  IV Zosyn and vancomycin    Subjective: His teenage children helped interpret. Overall feels better. Foley discomfort, wants out but no pain. MS back to baseline.  ROS: No chest pain or dyspnea.   Objective:  Vitals:   12/03/16 0800 12/03/16 0817 12/03/16 0900 12/03/16 1000  BP:  (!) 155/98    Pulse: 70 67 63 76  Resp: Temp:  97.4 F (36.3 C)    TempSrc:  Oral    SpO2: 99% 99% 98% 98%  Weight:      Height:        Examination:  General exam: Pleasant elderly male, moderately built and nourished lying comfortably propped up in bed. Does not look septic or toxic  currently. Mucosa moist. Respiratory system: Clear to auscultation. Respiratory effort normal. Cardiovascular system: S1 & S2 heard,  RRR. No JVD, murmurs, rubs, gallops or clicks. No pedal edema. Telemetry: Sinus rhythm. Gastrointestinal system: Abdomen is nondistended, soft and nontender. No organomegaly or masses felt. Normal bowel sounds heard. Foley catheter +. Subumbilical reducible uncomplicated ventral hernia. Central nervous system: Alert and oriented. Old LMN facial palsy/Bell's palsy. No dysarthria. Extremities: Symmetric 5 x 5 power. Skin: No rashes, lesions or ulcers Psychiatry: Judgement and insight appear normal. Mood & affect appropriate.     Data Reviewed: I have personally reviewed following labs and imaging studies  CBC:  Recent Labs Lab 12/01/16 2310 12/01/16 2330 12/03/16 0437  WBC 12.0*  --  6.5  NEUTROABS 10.7*  --   --   HGB 15.1 15.6 12.8*  HCT 43.2 46.0 38.5*  MCV 81.7  --  83.5  PLT 132*  --  128*   Basic Metabolic Panel:  Recent Labs Lab 12/01/16 2310 12/01/16 2330 12/03/16 0437  NA 131* 134* 135  K 4.0 4.1 4.6  CL 99* 99* 108  CO2 21*  --  21*  GLUCOSE 384* 395* 225*  BUN 27* 29* 21*  CREATININE 1.48* 1.40* 1.32*  CALCIUM 10.0  --  9.1   Liver Function Tests:  Recent Labs Lab 12/01/16 2310  AST 29  ALT 32  ALKPHOS 124  BILITOT 1.1  PROT 7.1  ALBUMIN 3.5   Coagulation Profile:  Recent Labs Lab 12/01/16 2310  INR 1.08   CBG:  Recent Labs Lab 12/02/16 1534 12/02/16 1918 12/02/16 2334 12/03/16 0347 12/03/16 0815  GLUCAP 358* 283* 217* 224* 224*    Recent Results (from the past 240 hour(s))  Blood Culture (routine x 2)     Status: None (Preliminary result)   Collection Time: 12/01/16 11:14 PM  Result Value Ref Range Status   Specimen Description BLOOD LEFT WRIST  Final   Special Requests   Final    BOTTLES DRAWN AEROBIC AND ANAEROBIC Blood Culture adequate volume   Culture  Setup Time   Final    GRAM POSITIVE COCCI IN CLUSTERS IN BOTH AEROBIC AND ANAEROBIC BOTTLES CRITICAL RESULT CALLED TO, READ BACK BY AND VERIFIED WITH: Ihor Austin PHARMD 2024  12/02/16 A BROWNING    Culture   Final    GRAM POSITIVE COCCI CULTURE REINCUBATED FOR BETTER GROWTH    Report Status PENDING  Incomplete  Blood Culture ID Panel (Reflexed)     Status: Abnormal   Collection Time: 12/01/16 11:14 PM  Result Value Ref Range Status   Enterococcus species NOT DETECTED NOT DETECTED Final   Listeria monocytogenes NOT DETECTED NOT DETECTED Final   Staphylococcus species DETECTED (A) NOT DETECTED Final    Comment: Methicillin (oxacillin) susceptible coagulase negative staphylococcus. Possible blood culture contaminant (unless isolated from more than one blood culture draw or clinical case suggests pathogenicity). No antibiotic treatment is indicated for blood  culture contaminants. CRITICAL RESULT CALLED TO, READ BACK BY AND VERIFIED WITH: Ihor Austin PHARMD 2024 12/02/16 A BROWNING    Staphylococcus aureus NOT DETECTED NOT DETECTED Final   Methicillin resistance NOT DETECTED NOT DETECTED Final   Streptococcus species NOT DETECTED NOT DETECTED Final   Streptococcus agalactiae NOT DETECTED NOT DETECTED Final   Streptococcus pneumoniae NOT DETECTED NOT DETECTED Final   Streptococcus pyogenes NOT DETECTED NOT DETECTED Final   Acinetobacter baumannii NOT DETECTED NOT DETECTED Final   Enterobacteriaceae species NOT DETECTED NOT DETECTED Final   Enterobacter cloacae  complex NOT DETECTED NOT DETECTED Final   Escherichia coli NOT DETECTED NOT DETECTED Final   Klebsiella oxytoca NOT DETECTED NOT DETECTED Final   Klebsiella pneumoniae NOT DETECTED NOT DETECTED Final   Proteus species NOT DETECTED NOT DETECTED Final   Serratia marcescens NOT DETECTED NOT DETECTED Final   Haemophilus influenzae NOT DETECTED NOT DETECTED Final   Neisseria meningitidis NOT DETECTED NOT DETECTED Final   Pseudomonas aeruginosa NOT DETECTED NOT DETECTED Final   Candida albicans NOT DETECTED NOT DETECTED Final   Candida glabrata NOT DETECTED NOT DETECTED Final   Candida krusei NOT DETECTED NOT  DETECTED Final   Candida parapsilosis NOT DETECTED NOT DETECTED Final   Candida tropicalis NOT DETECTED NOT DETECTED Final  Blood Culture (routine x 2)     Status: None (Preliminary result)   Collection Time: 12/01/16 11:15 PM  Result Value Ref Range Status   Specimen Description BLOOD RIGHT ARM  Final   Special Requests   Final    BOTTLES DRAWN AEROBIC AND ANAEROBIC Blood Culture adequate volume   Culture  Setup Time   Final    GRAM POSITIVE COCCI IN CLUSTERS IN BOTH AEROBIC AND ANAEROBIC BOTTLES CRITICAL RESULT CALLED TO, READ BACK BY AND VERIFIED WITH: Ihor Austin PHARMD 2024 12/02/16 A BROWNING    Culture   Final    GRAM POSITIVE COCCI CULTURE REINCUBATED FOR BETTER GROWTH    Report Status PENDING  Incomplete  Urine culture     Status: Abnormal (Preliminary result)   Collection Time: 12/01/16 11:38 PM  Result Value Ref Range Status   Specimen Description URINE, RANDOM  Final   Special Requests NONE  Final   Culture >=100,000 COLONIES/mL UNIDENTIFIED ORGANISM (A)  Final   Report Status PENDING  Incomplete  MRSA PCR Screening     Status: None   Collection Time: 12/02/16  6:36 AM  Result Value Ref Range Status   MRSA by PCR NEGATIVE NEGATIVE Final    Comment:        The GeneXpert MRSA Assay (FDA approved for NASAL specimens only), is one component of a comprehensive MRSA colonization surveillance program. It is not intended to diagnose MRSA infection nor to guide or monitor treatment for MRSA infections.          Radiology Studies: US Renal  Result Date: 12/02/2016 CLINICAL DATA:  Right hydronephrosis, urinary bladder diverticulum EXAM: RENAL / URINARY TRACT ULTRASOUND COMPLETE COMPARISON:  CT scan same day FINDINGS: Right Kidney: Length: 12.3 cm. Normal echogenicity. Mild right hydronephrosis. A cyst in upper pole measures 8 x 7 mm. Left Kidney: Length: 11.5 cm. Echogenicity within normal limits. No hydronephrosis. There is a cyst in midpole posterior aspect measures  2.3 x 2.1 cm. Bladder: There is a Foley catheter within partially decompressed urinary bladder. Persistent hypoechoic cystic lesion in right pelvis just lateral to the bladder which may represent bladder diverticulum or ureterocele. IMPRESSION: 1. Mild right hydronephrosis. Bilateral renal cysts. No left hydronephrosis. 2. There is a Foley catheter within partially decompressed urinary bladder. Persistent hypoechoic cystic lesion in right pelvis just lateral to the bladder which may represent bladder diverticulum or ureterocele. Electronically Signed   By: [name] Mead M.D.   On: 12/02/2016 14:58   Dg Chest Port 1 View  Result Date: 12/01/2016 CLINICAL DATA:  Altered mental status, onset today.  Fever. EXAM: PORTABLE CHEST 1 VIEW COMPARISON:  11/16/2011 FINDINGS: Shallow inspiration. Linear atelectasis in the lung bases. Heart size and pulmonary vascularity are normal for technique. No focal consolidation.  No blunting of costophrenic angles. No pneumothorax. Mediastinal contours appear intact. Degenerative changes in the spine. IMPRESSION: Shallow inspiration with linear atelectasis in the lung bases. Electronically Signed   By: Burman Nieves M.D.   On: 12/01/2016 23:41   Ct Renal Stone Study  Result Date: 12/02/2016 CLINICAL DATA:  Hematuria and sepsis.  Combative. EXAM: CT ABDOMEN AND PELVIS WITHOUT CONTRAST TECHNIQUE: Multidetector CT imaging of the abdomen and pelvis was performed following the standard protocol without IV contrast. COMPARISON:  None. FINDINGS: Lower chest: Atelectasis in the lung bases. Motion artifact limits evaluation. Coronary artery calcifications. Small esophageal hiatal hernia. Hepatobiliary: No focal liver abnormality is seen. No gallstones, gallbladder wall thickening, or biliary dilatation. Pancreas: Unremarkable. No pancreatic ductal dilatation or surrounding inflammatory changes. Spleen: Spleen is mildly enlarged.  No focal lesions. Adrenals/Urinary Tract: No adrenal gland  nodules. There is right hydronephrosis and hydroureter down to the level of the bladder. No obstructing stones are visualized. In the right pelvis adjacent to the bladder and displacing the bladder towards the left, there is a large cystic mass lesion. The lesion measures 8.3 x 10.9 cm. The mass displaces the right ureter towards the left. The ureter extends between the mass in the bladder. 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 This could possibly represent a large ureterocele. More likely, this represents a mesenteric cyst or other cystic mass. The bladder wall is not thickened and no bladder filling defects are demonstrated. No hydronephrosis of the left kidney. Parenchymal atrophy involving both kidneys. Small exophytic mass lesions arising from both kidneys are incompletely characterized on noncontrast imaging but likely represent cysts. The bladder is mildly distended suggesting possible bladder outlet obstruction. Stomach/Bowel: There is a moderate size right inguinal hernia containing a portion of the sigmoid colon. No proximal obstruction. There is adjacent fat herniation in the right inguinal region possibly representing a femoral hernia. There is a ventral anterior abdominal wall hernia to the left of midline containing small bowel. Herniated bowel are decompressed. No evidence of proximal obstruction. The appendix is normal. Stomach, small bowel, and colon are mostly decompressed without abnormal distention. Vascular/Lymphatic: Aortic atherosclerosis. No enlarged abdominal or pelvic lymph nodes. Reproductive: Prostate gland is enlarged, measuring 6.5 cm diameter. Other: No free air or free fluid in the abdomen. Musculoskeletal: Degenerative changes in the spine. No destructive bone lesions. IMPRESSION: 1. Large cystic mass in the right pelvis abutting the bladder. This may be causing extrinsic  compression of the right ureter. There is right-sided hydronephrosis and hydroureter without a stone identified. Etiology of the mass is uncertain. Could represent a bladder diverticulum all of the bladder wall appears to be continuous. The could represent a large ureterocele. It could represent a mesenteric mass or other cystic mass. Consider further evaluation with MRI. 2. Right inguinal hernia containing sigmoid colon without proximal obstruction. Left anterior ventral abdominal wall hernia containing small bowel without proximal obstruction. 3. Prominent enlargement of the prostate gland with moderate bladder distention possibly indicating outlet obstruction. 4. Small bilateral renal cysts. Bilateral renal parenchymal atrophy. Small esophageal hiatal hernia. Electronically Signed   By: Burman Nieves M.D.   On: 12/02/2016 01:10        Scheduled Meds: . finasteride  5 mg Oral Daily  . fondaparinux (ARIXTRA) injection  2.5 mg Subcutaneous Q24H  . insulin aspart  0-5 Units Subcutaneous QHS  . insulin aspart  0-9 Units Subcutaneous  TID WC  . insulin glargine  15 Units Subcutaneous Daily  . sodium chloride flush  3 mL Intravenous Q12H  . tamsulosin  0.4 mg Oral QPC supper   Continuous Infusions: . sodium chloride 125 mL/hr at 12/03/16 0700  . piperacillin-tazobactam (ZOSYN)  IV 3.375 g (12/03/16 0927)  . vancomycin Stopped (12/03/16 0109)     LOS: 1 day     HONGALGI,ANAND, MD, FACP, FHM. [facility] Pager [phone]  If 7PM-7AM, please contact night-coverage [email or web address] Password [facility] 12/03/2016, 11:07 AM

## 2016-12-04 DIAGNOSIS — N329 Bladder disorder, unspecified: Secondary | ICD-10-CM | POA: Diagnosis not present

## 2016-12-04 DIAGNOSIS — E1122 Type 2 diabetes mellitus with diabetic chronic kidney disease: Secondary | ICD-10-CM | POA: Diagnosis not present

## 2016-12-04 DIAGNOSIS — Z794 Long term (current) use of insulin: Secondary | ICD-10-CM | POA: Diagnosis not present

## 2016-12-04 DIAGNOSIS — N3001 Acute cystitis with hematuria: Secondary | ICD-10-CM | POA: Diagnosis not present

## 2016-12-04 DIAGNOSIS — N1330 Unspecified hydronephrosis: Secondary | ICD-10-CM | POA: Diagnosis not present

## 2016-12-04 DIAGNOSIS — E1165 Type 2 diabetes mellitus with hyperglycemia: Secondary | ICD-10-CM

## 2016-12-04 DIAGNOSIS — Z79899 Other long term (current) drug therapy: Secondary | ICD-10-CM

## 2016-12-04 DIAGNOSIS — N179 Acute kidney failure, unspecified: Secondary | ICD-10-CM

## 2016-12-04 DIAGNOSIS — N183 Chronic kidney disease, stage 3 (moderate): Secondary | ICD-10-CM | POA: Diagnosis not present

## 2016-12-04 DIAGNOSIS — A419 Sepsis, unspecified organism: Secondary | ICD-10-CM | POA: Diagnosis not present

## 2016-12-04 DIAGNOSIS — Z9102 Food additives allergy status: Secondary | ICD-10-CM | POA: Diagnosis not present

## 2016-12-04 DIAGNOSIS — N136 Pyonephrosis: Secondary | ICD-10-CM | POA: Diagnosis not present

## 2016-12-04 DIAGNOSIS — R7881 Bacteremia: Secondary | ICD-10-CM | POA: Diagnosis not present

## 2016-12-04 DIAGNOSIS — N390 Urinary tract infection, site not specified: Secondary | ICD-10-CM | POA: Diagnosis not present

## 2016-12-04 DIAGNOSIS — A411 Sepsis due to other specified staphylococcus: Secondary | ICD-10-CM | POA: Diagnosis not present

## 2016-12-04 LAB — BASIC METABOLIC PANEL WITH GFR
Anion gap: 9 (ref 5–15)
BUN: 18 mg/dL (ref 6–20)
CO2: 20 mmol/L — ABNORMAL LOW (ref 22–32)
Calcium: 9.6 mg/dL (ref 8.9–10.3)
Chloride: 105 mmol/L (ref 101–111)
Creatinine, Ser: 1.18 mg/dL (ref 0.61–1.24)
GFR calc Af Amer: >60 mL/min
GFR calc non Af Amer: 58 mL/min — ABNORMAL LOW
Glucose, Bld: 181 mg/dL — ABNORMAL HIGH (ref 65–99)
Potassium: 3.9 mmol/L (ref 3.5–5.1)
Sodium: 134 mmol/L — ABNORMAL LOW (ref 135–145)

## 2016-12-04 LAB — CBC
HCT: 41.4 % (ref 39.0–52.0)
Hemoglobin: 13.9 g/dL (ref 13.0–17.0)
MCH: 27.9 pg (ref 26.0–34.0)
MCHC: 33.6 g/dL (ref 30.0–36.0)
MCV: 83 fL (ref 78.0–100.0)
Platelets: 128 K/uL — ABNORMAL LOW (ref 150–400)
RBC: 4.99 MIL/uL (ref 4.22–5.81)
RDW: 14.1 % (ref 11.5–15.5)
WBC: 7 K/uL (ref 4.0–10.5)

## 2016-12-04 LAB — GLUCOSE, CAPILLARY
GLUCOSE-CAPILLARY: 242 mg/dL — AB (ref 65–99)
Glucose-Capillary: 170 mg/dL — ABNORMAL HIGH (ref 65–99)
Glucose-Capillary: 237 mg/dL — ABNORMAL HIGH (ref 65–99)
Glucose-Capillary: 195 mg/dL — ABNORMAL HIGH (ref 65–99)

## 2016-12-04 LAB — URINE CULTURE

## 2016-12-04 MED ORDER — AMLODIPINE BESYLATE 5 MG PO TABS
5.0000 mg | ORAL_TABLET | Freq: Every day | ORAL | Status: DC
  Administered 2016-12-04 – 2016-12-07 (×4): 5 mg via ORAL
  Filled 2016-12-04 (×4): qty 1

## 2016-12-04 MED ORDER — CEFAZOLIN SODIUM-DEXTROSE 2-4 GM/100ML-% IV SOLN
2.0000 g | Freq: Three times a day (TID) | INTRAVENOUS | Status: DC
  Administered 2016-12-04 – 2016-12-07 (×8): 2 g via INTRAVENOUS
  Filled 2016-12-04 (×11): qty 100

## 2016-12-04 NOTE — Progress Notes (Signed)
Pharmacy Antibiotic Note  Daniel Porter is a 76 y.o. male admitted on 12/01/2016 with sepsis.  Now with CoNS bacteremia.  Pharmacy has been consulted to narrow antibiotics to Ancef monotherapy.  Patient's renal function is improving.   Plan: - Ancef 2gm IV Q8H - Monitor renal fxn, clinical progress   Height:  (180.3 cm) Weight: 196 lb 13.9 oz (89.3 kg) IBW/kg (Calculated) : 75.3  Temp (24hrs), Avg:97.8 F (36.6 C), Min:97.6 F (36.4 C), Max:98 F (36.7 C)   Recent Labs Lab 12/01/16 2310 12/01/16 2327 12/01/16 2330 12/02/16 0155 12/03/16 0437 12/04/16 0608  WBC 12.0*  --   --   --  6.5 7.0  CREATININE 1.48*  --  1.40*  --  1.32* 1.18  LATICACIDVEN  --  2.05*  --  1.89  --   --     Estimated Creatinine Clearance: 56.7 mL/min (by C-G formula based on SCr of 1.18 mg/dL).    Allergies  Allergen Reactions  . Pork-Derived Products Other (See Comments)    Pt doesn't consume any pork products     Vanc 4/20 >> 4/22 Ancef 4/22 >> Zosyn 4/20 >> 4/22  4/19 BCx x2 - CoNS (BCID staph species, pan sensitive) 4/19 UCx - CoNS (pan sensitive) 4/20 MRSA PCR - negative     Kashauna Celmer D. Laney Potash, PharmD, BCPS Pager:  [phone] 12/04/2016, 1:43 PM

## 2016-12-04 NOTE — Progress Notes (Signed)
PROGRESS NOTE   Daniel Porter  ZOX:[id number]    DOB: Mar 11, 1941    DOA: 12/01/2016  PCP: No PCP Per Patient   I have briefly reviewed patients previous medical records in [facility].  Brief Narrative:  76 y/o widowed male, Arabic speaking, does not see a PCP, PMH of untreated DM, GERD, HTN, recent R Bells palsy and on Prednisone & Valtrex, recurrent UIT's in the past 2 years, presented to the ED after his family called EMS d/t shaking chills and AMS. Septic on admission. Urology consulted and S/P Foley for R hydronephrosis due to bladder & R bladder diverticulum distension. Admitted to Step down. Blood culture and Urine culture >100 K confirm coagulase negative staph. Infectious disease consulted 4/22   Assessment & Plan:   Principal Problem:   Pyohydronephrosis Active Problems:   DM2 (diabetes mellitus, type 2) (HCC)   Sepsis secondary to UTI (HCC)   Bladder mass   CKD (chronic kidney disease) stage 3, GFR 30-59 ml/min   1. Sepsis d/t coagulase negative Staphylococcus complicated UTI and bacteremia: Treated per sepsis protocol with aggressive IV fluids and IV Vancomycin & Zosyn (Prior enterococcal & E Coli UTI). Sepsis physiology resolved. Discontinue Zosyn. Continue IV vancomycin pending ID consultation. 2. Right Hydronephrosis/hydroureter: likely due to bladder and right bladder diverticulum distention. Urology consulted> Foley catheter placed and 1100 ml urine drained initially. 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 Recommending voiding trial/Foley catheter removal prior to discharge and outpatient follow-up with Dr Mena Goes for cystoscopy and repeat renal ultrasound as outpatient. DC Foley catheter & Voiding trial 4/22 . 3. 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 Per Urology > voiding trail once stable. OP Urology follow up in  a few weeks. 4. Acute encephalopathy: Likely related to sepsis. No new focal deficits. Resolved. 5. Thrombocytopenia:? Related to sepsis. Follow CBCs. No bleeding reported. Stable. 6. Hyponatremia: Due to dehydration and hyperglycemia. Resolved 7. Stage 3 CKD: Creatinine has normalized to 1.18 on 4/22. 8. Uncontrolled/untreated DM2: stated home CBG's ranged from 180-400 and drinks sweat drinks nightly. Also precipitated by recent steroids.Diabetes coordinator consultation appreciated and discussed with them 4/20. Hemoglobin A1c: 10.2. Increased Lantus 15 units subcutaneously daily and continue SSI. Monitor closely. If patient complies, would ideally DC home on Insulins> discussed with family. Patient tolerated complaining about multiple fingersticks CBG checks. 9. Essential hypertension: Mildly uncontrolled. Start amlodipine 5 MG daily. Reluctant to start ACEI/ARB due to recent acute kidney injury and question about compliance to M.D. follow-ups and lab draws. 10. Recent right-sided Bell's palsy: Apparently occurred 2 weeks ago. Didn't fill one of the meds ? antiviral   DVT prophylaxis: Arixtra Code Status: Full Family Communication: Discussed in detail with patient's son at bedside. Disposition: DC home when medically stable. Transfer to medical bed 4/21. Possible discharge home 4/23 pending ID input regarding choice of antibiotics.   Consultants:  Urology  Infectious disease  Procedures:  Foley catheter-DC'd 4/22  Antimicrobials:  IV Zosyn-discontinued 4/22. IV vancomycin >   Subjective: His teenage children helped interpret. Complaining about frequent fingersticks CBG checks and doesn't see needed. Mild right sided neck pain, improved after Tylenol. Denies any other complaints.  ROS: No chest pain or dyspnea.   Objective:  Vitals:   12/03/16 2016 12/03/16 2258 12/04/16 0515 12/04/16 1100  BP: (!) 169/91 (!) 160/80 (!) 156/84 (!) 163/96  Pulse: 70 72 72   Resp: 19  17     Temp: 98 F (36.7 C)  97.6 F (36.4 C)   TempSrc: Oral  Oral   SpO2: 97%  99%   Weight:      Height:        Examination:  General exam: Pleasant elderly male, moderately built and nourished lying comfortably propped up in bed. Does not look septic or toxic currently. Mucosa moist. Respiratory system: Clear to auscultation. Respiratory effort normal. Cardiovascular system: S1 & S2 heard, RRR. No JVD, murmurs, rubs, gallops or clicks. No pedal edema. Gastrointestinal system: Abdomen is nondistended, soft and nontender. No organomegaly or masses felt. Normal bowel sounds heard. Foley catheter +. Subumbilical reducible uncomplicated ventral hernia. Central nervous system: Alert and oriented. Old LMN facial palsy/Bell's palsy. No dysarthria. Extremities: Symmetric 5 x 5 power. Skin: No rashes, lesions or ulcers Psychiatry: Judgement and insight appear normal. Mood & affect appropriate.  Musculoskeletal system: No acute findings on neck exam.    Data Reviewed: I have personally reviewed following labs and imaging studies  CBC:  Recent Labs Lab 12/01/16 2310 12/01/16 2330 12/03/16 0437 12/04/16 0608  WBC 12.0*  --  6.5 7.0  NEUTROABS 10.7*  --   --   --   HGB 15.1 15.6 12.8* 13.9  HCT 43.2 46.0 38.5* 41.4  MCV 81.7  --  83.5 83.0  PLT 132*  --  128* 128*   Basic Metabolic Panel:  Recent Labs Lab 12/01/16 2310 12/01/16 2330 12/03/16 0437 12/04/16 0608  NA 131* 134* 135 134*  K 4.0 4.1 4.6 3.9  CL 99* 99* 108 105  CO2 21*  --  21* 20*  GLUCOSE 384* 395* 225* 181*  BUN 27* 29* 21* 18  CREATININE 1.48* 1.40* 1.32* 1.18  CALCIUM 10.0  --  9.1 9.6   Liver Function Tests:  Recent Labs Lab 12/01/16 2310  AST 29  ALT 32  ALKPHOS 124  BILITOT 1.1  PROT 7.1  ALBUMIN 3.5   Coagulation Profile:  Recent Labs Lab 12/01/16 2310  INR 1.08   CBG:  Recent Labs Lab 12/03/16 1720 12/03/16 2116 12/03/16 2216 12/04/16 0727 12/04/16 1157  GLUCAP 184* 226* 200*  170* 237*    Recent Results (from the past 240 hour(s))  Blood Culture (routine x 2)     Status: Abnormal (Preliminary result)   Collection Time: 12/01/16 11:14 PM  Result Value Ref Range Status   Specimen Description BLOOD LEFT WRIST  Final   Special Requests   Final    BOTTLES DRAWN AEROBIC AND ANAEROBIC Blood Culture adequate volume   Culture  Setup Time   Final    GRAM POSITIVE COCCI IN CLUSTERS IN BOTH AEROBIC AND ANAEROBIC BOTTLES CRITICAL RESULT CALLED TO, READ BACK BY AND VERIFIED WITH: Ihor Austin PHARMD 2024 12/02/16 A BROWNING    Culture (A)  Final    STAPHYLOCOCCUS SPECIES (COAGULASE NEGATIVE) REPEATING SENSITIVITIES    Report Status PENDING  Incomplete  Blood Culture ID Panel (Reflexed)     Status: Abnormal   Collection Time: 12/01/16 11:14 PM  Result Value Ref Range Status   Enterococcus species NOT DETECTED NOT DETECTED Final   Listeria monocytogenes NOT DETECTED NOT DETECTED Final   Staphylococcus species DETECTED (A) NOT DETECTED Final    Comment: Methicillin (oxacillin) susceptible coagulase negative staphylococcus. Possible blood culture contaminant (unless isolated from more than one blood culture draw or clinical case suggests pathogenicity). No antibiotic treatment is indicated for blood  culture contaminants. CRITICAL RESULT CALLED TO, READ BACK  BY AND VERIFIED WITH: Ihor Austin PHARMD 2024 12/02/16 A BROWNING    Staphylococcus aureus NOT DETECTED NOT DETECTED Final   Methicillin resistance NOT DETECTED NOT DETECTED Final   Streptococcus species NOT DETECTED NOT DETECTED Final   Streptococcus agalactiae NOT DETECTED NOT DETECTED Final   Streptococcus pneumoniae NOT DETECTED NOT DETECTED Final   Streptococcus pyogenes NOT DETECTED NOT DETECTED Final   Acinetobacter baumannii NOT DETECTED NOT DETECTED Final   Enterobacteriaceae species NOT DETECTED NOT DETECTED Final   Enterobacter cloacae complex NOT DETECTED NOT DETECTED Final   Escherichia coli NOT DETECTED NOT  DETECTED Final   Klebsiella oxytoca NOT DETECTED NOT DETECTED Final   Klebsiella pneumoniae NOT DETECTED NOT DETECTED Final   Proteus species NOT DETECTED NOT DETECTED Final   Serratia marcescens NOT DETECTED NOT DETECTED Final   Haemophilus influenzae NOT DETECTED NOT DETECTED Final   Neisseria meningitidis NOT DETECTED NOT DETECTED Final   Pseudomonas aeruginosa NOT DETECTED NOT DETECTED Final   Candida albicans NOT DETECTED NOT DETECTED Final   Candida glabrata NOT DETECTED NOT DETECTED Final   Candida krusei NOT DETECTED NOT DETECTED Final   Candida parapsilosis NOT DETECTED NOT DETECTED Final   Candida tropicalis NOT DETECTED NOT DETECTED Final  Blood Culture (routine x 2)     Status: Abnormal (Preliminary result)   Collection Time: 12/01/16 11:15 PM  Result Value Ref Range Status   Specimen Description BLOOD RIGHT ARM  Final   Special Requests   Final    BOTTLES DRAWN AEROBIC AND ANAEROBIC Blood Culture adequate volume   Culture  Setup Time   Final    GRAM POSITIVE COCCI IN CLUSTERS IN BOTH AEROBIC AND ANAEROBIC BOTTLES CRITICAL RESULT CALLED TO, READ BACK BY AND VERIFIED WITH: Ihor Austin PHARMD 2024 12/02/16 A BROWNING    Culture STAPHYLOCOCCUS SPECIES (COAGULASE NEGATIVE) (A)  Final   Report Status PENDING  Incomplete  Urine culture     Status: Abnormal   Collection Time: 12/01/16 11:38 PM  Result Value Ref Range Status   Specimen Description URINE, RANDOM  Final   Special Requests NONE  Final   Culture (A)  Final    >=100,000 COLONIES/mL STAPHYLOCOCCUS SPECIES (COAGULASE NEGATIVE)   Report Status 12/04/2016 FINAL  Final   Organism ID, Bacteria STAPHYLOCOCCUS SPECIES (COAGULASE NEGATIVE) (A)  Final      Susceptibility   Staphylococcus species (coagulase negative) - MIC*    CIPROFLOXACIN <=0.5 SENSITIVE Sensitive     GENTAMICIN <=0.5 SENSITIVE Sensitive     NITROFURANTOIN <=16 SENSITIVE Sensitive     OXACILLIN <=0.25 SENSITIVE Sensitive     TETRACYCLINE <=1 SENSITIVE  Sensitive     VANCOMYCIN 1 SENSITIVE Sensitive     TRIMETH/SULFA <=10 SENSITIVE Sensitive     CLINDAMYCIN <=0.25 SENSITIVE Sensitive     RIFAMPIN <=0.5 SENSITIVE Sensitive     Inducible Clindamycin NEGATIVE Sensitive     * >=100,000 COLONIES/mL STAPHYLOCOCCUS SPECIES (COAGULASE NEGATIVE)  MRSA PCR Screening     Status: None   Collection Time: 12/02/16  6:36 AM  Result Value Ref Range Status   MRSA by PCR NEGATIVE NEGATIVE Final    Comment:        The GeneXpert MRSA Assay (FDA approved for NASAL specimens only), is one component of a comprehensive MRSA colonization surveillance program. It is not intended to diagnose MRSA infection nor to guide or monitor treatment for MRSA infections.          Radiology Studies: US Renal  Result Date: 12/02/2016 CLINICAL  DATA:  Right hydronephrosis, urinary bladder diverticulum EXAM: RENAL / URINARY TRACT ULTRASOUND COMPLETE COMPARISON:  CT scan same day FINDINGS: Right Kidney: Length: 12.3 cm. Normal echogenicity. Mild right hydronephrosis. A cyst in upper pole measures 8 x 7 mm. Left Kidney: Length: 11.5 cm. Echogenicity within normal limits. No hydronephrosis. There is a cyst in midpole posterior aspect measures 2.3 x 2.1 cm. Bladder: There is a Foley catheter within partially decompressed urinary bladder. Persistent hypoechoic cystic lesion in right pelvis just lateral to the bladder which may represent bladder diverticulum or ureterocele. IMPRESSION: 1. Mild right hydronephrosis. Bilateral renal cysts. No left hydronephrosis. 2. There is a Foley catheter within partially decompressed urinary bladder. Persistent hypoechoic cystic lesion in right pelvis just lateral to the bladder which may represent bladder diverticulum or ureterocele. Electronically Signed   By: [name] Mead M.D.   On: 12/02/2016 14:58        Scheduled Meds: . finasteride  5 mg Oral Daily  . fondaparinux (ARIXTRA) injection  2.5 mg Subcutaneous Q24H  . insulin aspart  0-5  Units Subcutaneous QHS  . insulin aspart  0-9 Units Subcutaneous TID WC  . insulin glargine  15 Units Subcutaneous Daily  . sodium chloride flush  3 mL Intravenous Q12H  . tamsulosin  0.4 mg Oral QPC supper   Continuous Infusions: . vancomycin Stopped (12/04/16 0110)     LOS: 2 days     Leanny Moeckel, MD, FACP, FHM. [facility] Pager [phone]  If 7PM-7AM, please contact night-coverage [email or web address] Password [facility] 12/04/2016, 12:22 PM

## 2016-12-04 NOTE — Consult Note (Addendum)
[facility]       Reason for Consult: CoNS bacteremia3    Referring Physician: Dr. Waymon Amato  Principal Problem:   Pyohydronephrosis Active Problems:   DM2 (diabetes mellitus, type 2) (HCC)   Sepsis secondary to UTI [facility])   Bladder mass   CKD (chronic kidney disease) stage 3, GFR 30-59 ml/min   . amLODipine  5 mg Oral Daily  . finasteride  5 mg Oral Daily  . fondaparinux (ARIXTRA) injection  2.5 mg Subcutaneous Q24H  . insulin aspart  0-5 Units Subcutaneous QHS  . insulin aspart  0-9 Units Subcutaneous TID WC  . insulin glargine  15 Units Subcutaneous Daily  . sodium chloride flush  3 mL Intravenous Q12H  . tamsulosin  0.4 mg Oral QPC supper    Recommendations: Cefazolin Repeat blood cultures  Routine HIV, hepatitis C  Assessment: He has CoNS bacteremia with initial sepsis, now resolved.  Source urinary.  No pacemaker or other hardware.  Does have mesh at site of umbilical surgery.    Dr. Ninetta Lights on tomorrow  Antibiotics: Vancomycin and zosyn  HPI: Daniel Porter is a 76 y.o. male from [place], presented on 4/20 with fever, leukocytes in urine and CT with hydroureteronephrosis and large right bladder diverticulum. He had noted urinary retention and was altered at home and EMS called.  Blood and urine cultures with CoNS, methicillin sensitive on BCID and urine sensitivities.  He feels better since admission.  Also recently with Bell's Palsy.  Lactate intially elevated to 2.05 and normalized with fluid resuscitation.  Patient improved.  History for the patient and son at the bedside who helped with some words in translation. Previous history reviewed and op report from 2013 with repair of supraumbilical hernia repair with mesh placement.   CT scan independently reviewed and did have hydronephrosis and mass area noted.   Personally discussed with Dr. Waymon Amato  Review of Systems:  Constitutional: negative for fevers and chills Gastrointestinal:  negative for diarrhea Integument/breast: negative for rash All other systems reviewed and are negative    Past Medical History:  Diagnosis Date  . Diabetes mellitus without complication (HCC)   . GERD (gastroesophageal reflux disease)   . Hyperlipidemia   . Hypertension   . Initial insomnia   . Umbilical hernia     Social History  Substance Use Topics  . Smoking status: Never Smoker  . Smokeless tobacco: Never Used  . Alcohol use No    History reviewed. No pertinent family history.  Allergies  Allergen Reactions  . Pork-Derived Products Other (See Comments)    Pt doesn't consume any pork products    Physical Exam: Constitutional: in no apparent distress and alert  Vitals:   12/04/16 0515 12/04/16 1100  BP: (!) 156/84 (!) 163/96  Pulse: 72   Resp: 17   Temp: 97.6 F (36.4 C)    EYES: anicteric ENMT: no thrush Cardiovascular: Cor RRR Respiratory: CTA B; normal respiratory effort GI: Bowel sounds are normal, liver is not enlarged, spleen is not enlarged, soft, umbilical hernia Musculoskeletal: no pedal edema noted Skin: negatives: no rash  Lab Results  Component Value Date   WBC 7.0 12/04/2016   HGB 13.9 12/04/2016   HCT 41.4 12/04/2016   MCV 83.0 12/04/2016   PLT 128 (L) 12/04/2016    Lab Results  Component Value Date   CREATININE 1.18 12/04/2016   BUN 18 12/04/2016   NA 134 (L) 12/04/2016   K 3.9 12/04/2016  CL 105 12/04/2016   CO2 20 (L) 12/04/2016    Lab Results  Component Value Date   ALT 32 12/01/2016   AST 29 12/01/2016   ALKPHOS 124 12/01/2016     Microbiology: Recent Results (from the past 240 hour(s))  Blood Culture (routine x 2)     Status: Abnormal (Preliminary result)   Collection Time: 12/01/16 11:14 PM  Result Value Ref Range Status   Specimen Description BLOOD LEFT WRIST  Final   Special Requests   Final    BOTTLES DRAWN AEROBIC AND ANAEROBIC Blood Culture adequate volume   Culture  Setup Time   Final    GRAM POSITIVE COCCI  IN CLUSTERS IN BOTH AEROBIC AND ANAEROBIC BOTTLES CRITICAL RESULT CALLED TO, READ BACK BY AND VERIFIED WITH: Ihor Austin PHARMD 2024 12/02/16 A BROWNING    Culture (A)  Final    STAPHYLOCOCCUS SPECIES (COAGULASE NEGATIVE) REPEATING SENSITIVITIES    Report Status PENDING  Incomplete  Blood Culture ID Panel (Reflexed)     Status: Abnormal   Collection Time: 12/01/16 11:14 PM  Result Value Ref Range Status   Enterococcus species NOT DETECTED NOT DETECTED Final   Listeria monocytogenes NOT DETECTED NOT DETECTED Final   Staphylococcus species DETECTED (A) NOT DETECTED Final    Comment: Methicillin (oxacillin) susceptible coagulase negative staphylococcus. Possible blood culture contaminant (unless isolated from more than one blood culture draw or clinical case suggests pathogenicity). No antibiotic treatment is indicated for blood  culture contaminants. CRITICAL RESULT CALLED TO, READ BACK BY AND VERIFIED WITH: J MILLEN PHARMD 2024 12/02/16 A BROWNING    Staphylococcus aureus NOT DETECTED NOT DETECTED Final   Methicillin resistance NOT DETECTED NOT DETECTED Final   Streptococcus species NOT DETECTED NOT DETECTED Final   Streptococcus agalactiae NOT DETECTED NOT DETECTED Final   Streptococcus pneumoniae NOT DETECTED NOT DETECTED Final   Streptococcus pyogenes NOT DETECTED NOT DETECTED Final   Acinetobacter baumannii NOT DETECTED NOT DETECTED Final   Enterobacteriaceae species NOT DETECTED NOT DETECTED Final   Enterobacter cloacae complex NOT DETECTED NOT DETECTED Final   Escherichia coli NOT DETECTED NOT DETECTED Final   Klebsiella oxytoca NOT DETECTED NOT DETECTED Final   Klebsiella pneumoniae NOT DETECTED NOT DETECTED Final   Proteus species NOT DETECTED NOT DETECTED Final   Serratia marcescens NOT DETECTED NOT DETECTED Final   Haemophilus influenzae NOT DETECTED NOT DETECTED Final   Neisseria meningitidis NOT DETECTED NOT DETECTED Final   Pseudomonas aeruginosa NOT DETECTED NOT DETECTED  Final   Candida albicans NOT DETECTED NOT DETECTED Final   Candida glabrata NOT DETECTED NOT DETECTED Final   Candida krusei NOT DETECTED NOT DETECTED Final   Candida parapsilosis NOT DETECTED NOT DETECTED Final   Candida tropicalis NOT DETECTED NOT DETECTED Final  Blood Culture (routine x 2)     Status: Abnormal (Preliminary result)   Collection Time: 12/01/16 11:15 PM  Result Value Ref Range Status   Specimen Description BLOOD RIGHT ARM  Final   Special Requests   Final    BOTTLES DRAWN AEROBIC AND ANAEROBIC Blood Culture adequate volume   Culture  Setup Time   Final    GRAM POSITIVE COCCI IN CLUSTERS IN BOTH AEROBIC AND ANAEROBIC BOTTLES CRITICAL RESULT CALLED TO, READ BACK BY AND VERIFIED WITH: J MILLEN PHARMD 2024 12/02/16 A BROWNING    Culture STAPHYLOCOCCUS SPECIES (COAGULASE NEGATIVE) (A)  Final   Report Status PENDING  Incomplete  Urine culture     Status: Abnormal   Collection Time: 12/01/16  11:38 PM  Result Value Ref Range Status   Specimen Description URINE, RANDOM  Final   Special Requests NONE  Final   Culture (A)  Final    >=100,000 COLONIES/mL STAPHYLOCOCCUS SPECIES (COAGULASE NEGATIVE)   Report Status 12/04/2016 FINAL  Final   Organism ID, Bacteria STAPHYLOCOCCUS SPECIES (COAGULASE NEGATIVE) (A)  Final      Susceptibility   Staphylococcus species (coagulase negative) - MIC*    CIPROFLOXACIN <=0.5 SENSITIVE Sensitive     GENTAMICIN <=0.5 SENSITIVE Sensitive     NITROFURANTOIN <=16 SENSITIVE Sensitive     OXACILLIN <=0.25 SENSITIVE Sensitive     TETRACYCLINE <=1 SENSITIVE Sensitive     VANCOMYCIN 1 SENSITIVE Sensitive     TRIMETH/SULFA <=10 SENSITIVE Sensitive     CLINDAMYCIN <=0.25 SENSITIVE Sensitive     RIFAMPIN <=0.5 SENSITIVE Sensitive     Inducible Clindamycin NEGATIVE Sensitive     * >=100,000 COLONIES/mL STAPHYLOCOCCUS SPECIES (COAGULASE NEGATIVE)  MRSA PCR Screening     Status: None   Collection Time: 12/02/16  6:36 AM  Result Value Ref Range Status     MRSA by PCR NEGATIVE NEGATIVE Final    Comment:        The GeneXpert MRSA Assay (FDA approved for NASAL specimens only), is one component of a comprehensive MRSA colonization surveillance program. It is not intended to diagnose MRSA infection nor to guide or monitor treatment for MRSA infections.     Staci Righter, MD [facility] [email or web address] [phone] pager  [phone] cell 12/04/2016, 1:38 PM

## 2016-12-05 DIAGNOSIS — N3001 Acute cystitis with hematuria: Secondary | ICD-10-CM

## 2016-12-05 DIAGNOSIS — R7881 Bacteremia: Secondary | ICD-10-CM | POA: Diagnosis not present

## 2016-12-05 DIAGNOSIS — E1165 Type 2 diabetes mellitus with hyperglycemia: Secondary | ICD-10-CM | POA: Diagnosis not present

## 2016-12-05 DIAGNOSIS — E118 Type 2 diabetes mellitus with unspecified complications: Secondary | ICD-10-CM

## 2016-12-05 DIAGNOSIS — A411 Sepsis due to other specified staphylococcus: Secondary | ICD-10-CM | POA: Diagnosis not present

## 2016-12-05 LAB — BASIC METABOLIC PANEL
ANION GAP: 6 (ref 5–15)
BUN: 16 mg/dL (ref 6–20)
CHLORIDE: 106 mmol/L (ref 101–111)
CO2: 22 mmol/L (ref 22–32)
Calcium: 9.8 mg/dL (ref 8.9–10.3)
Creatinine, Ser: 1.13 mg/dL (ref 0.61–1.24)
Glucose, Bld: 186 mg/dL — ABNORMAL HIGH (ref 65–99)
POTASSIUM: 4 mmol/L (ref 3.5–5.1)
SODIUM: 134 mmol/L — AB (ref 135–145)

## 2016-12-05 LAB — CULTURE, BLOOD (ROUTINE X 2)
SPECIAL REQUESTS: ADEQUATE
Special Requests: ADEQUATE

## 2016-12-05 LAB — GLUCOSE, CAPILLARY
GLUCOSE-CAPILLARY: 164 mg/dL — AB (ref 65–99)
GLUCOSE-CAPILLARY: 195 mg/dL — AB (ref 65–99)
Glucose-Capillary: 186 mg/dL — ABNORMAL HIGH (ref 65–99)
Glucose-Capillary: 188 mg/dL — ABNORMAL HIGH (ref 65–99)
Glucose-Capillary: 218 mg/dL — ABNORMAL HIGH (ref 65–99)

## 2016-12-05 LAB — HIV ANTIBODY (ROUTINE TESTING W REFLEX): HIV Screen 4th Generation wRfx: NONREACTIVE

## 2016-12-05 MED ORDER — INSULIN STARTER KIT- SYRINGES (ENGLISH)
1.0000 | Freq: Once | Status: AC
  Administered 2016-12-06: 1
  Filled 2016-12-05 (×2): qty 1

## 2016-12-05 MED ORDER — WHITE PETROLATUM GEL
Status: AC
  Administered 2016-12-05: 23:00:00
  Filled 2016-12-05: qty 1

## 2016-12-05 MED ORDER — TRAMADOL HCL 50 MG PO TABS
50.0000 mg | ORAL_TABLET | Freq: Four times a day (QID) | ORAL | Status: AC | PRN
  Administered 2016-12-05 – 2016-12-07 (×2): 50 mg via ORAL
  Filled 2016-12-05 (×2): qty 1

## 2016-12-05 NOTE — Progress Notes (Signed)
Pt. Requesting ibuprofen. MD text paged.

## 2016-12-05 NOTE — Progress Notes (Signed)
   Pt looks well. Sitting in chair. Voiding normally. Urine clear.   Vitals:   12/04/16 2143 12/05/16 0545  BP: 110/86 (!) 143/80  Pulse: 79 73  Resp: 17 18  Temp: 98.2 F (36.8 C) 97.6 F (36.4 C)    Intake/Output Summary (Last 24 hours) at 12/05/16 0846 Last data filed at 12/05/16 0549  Gross per 24 hour  Intake              942 ml  Output             1185 ml  Net             -243 ml   NAD Sitting in chair Resp - reg effort, depth  Abd - soft, NT Ext - no CCE Urine - clear in urinal   BMET    Component Value Date/Time   NA 134 (L) 12/05/2016 0414   K 4.0 12/05/2016 0414   CL 106 12/05/2016 0414   CO2 22 12/05/2016 0414   GLUCOSE 186 (H) 12/05/2016 0414   BUN 16 12/05/2016 0414   CREATININE 1.13 12/05/2016 0414   CALCIUM 9.8 12/05/2016 0414   GFRNONAA >60 12/05/2016 0414   GFRAA >60 12/05/2016 0414    A/p -  1) UTI/bactermeia - abx per ID. Appreciate their input.  2) BPH - continue tamsulosin and finasteride on discharge 3) bladder diverticulum, right hydro - f/u for cysto/renal u/s in oupt - I will arrange.   Will sign off. Appreciate excellent hospitalist care.

## 2016-12-05 NOTE — Care Management Note (Signed)
Case Management Note  Patient Details  Name: Daniel Porter MRN: [id number] Date of Birth: [birth year]  Subjective/Objective:                    Action/Plan:  Spoke to patient and son at bedside. Confirmed patient does have Medicaid . Insulin 70/30 and Lantus both on [employer] Medicaid preferred list , therefore co pay per prescription is $3. Patient and son confirmed they can afford same. Patient already has machine at home to check his blood sugar.  Patient has a Medicaid assigned MD, however, he "doesn't trust her". [facility] has been [facility] as [facility]. Explained to him and son he needs PCP . Patient aware he has to go through [facility] to request a different PCP. However, in mean time he can go to [facility] , [address] [place], [place], [place] [address], phone [phone]. Patient knows Dr Judie Petit. Matthews there and is agreeable to go. He has appointment December 09, 2016 at 1000 am. Patient and son has information and voiced understanding.   [facility] has no appointments available at this time. Expected Discharge Date:                  Expected Discharge Plan:  Home/Self Care  In-House Referral:     Discharge planning Services  CM Consult, Indigent Health Clinic  Post Acute Care Choice:    Choice offered to:  Patient, Adult Children  DME Arranged:    DME Agency:     HH Arranged:    HH Agency:     Status of Service:  Completed, signed off  If discussed at [employer] of Stay Meetings, dates discussed:    Additional Comments:  Kingsley Plan, RN 12/05/2016, 10:15 AM

## 2016-12-05 NOTE — Progress Notes (Signed)
PHARMACY CONSULT NOTE FOR:  OUTPATIENT  PARENTERAL ANTIBIOTIC THERAPY (OPAT)  Indication: bacteremia Regimen: Ancef 2gm IV q8h End date: 12/18/2016  IV antibiotic discharge orders are pended. To discharging provider:  please sign these orders via discharge navigator,  Select New Orders & click on the button choice - Manage This Unsigned Work.     Thank you for allowing pharmacy to be a part of this patient's care.  Harland German, Pharm D 12/05/2016 5:33 PM

## 2016-12-05 NOTE — Progress Notes (Signed)
INFECTIOUS DISEASE PROGRESS NOTE  ID: Daniel Porter is a 76 y.o. male with  Principal Problem:   Pyohydronephrosis Active Problems:   DM2 (diabetes mellitus, type 2) ([place])   Sepsis secondary to UTI ([place])   Bladder mass   CKD (chronic kidney disease) stage 3, GFR 30-59 ml/min  Subjective: No complaints.   Abtx:  Anti-infectives    Start     Dose/Rate Route Frequency Ordered Stop   12/04/16 2200  ceFAZolin (ANCEF) IVPB 2g/100 mL premix     2 g 200 mL/hr over 30 Minutes Intravenous Every 8 hours 12/04/16 1343     12/02/16 1200  vancomycin (VANCOCIN) IVPB 750 mg/150 ml premix  Status:  Discontinued     750 mg 150 mL/hr over 60 Minutes Intravenous Every 12 hours 12/02/16 0254 12/04/16 1337   12/02/16 0800  piperacillin-tazobactam (ZOSYN) IVPB 3.375 g  Status:  Discontinued     3.375 g 12.5 mL/hr over 240 Minutes Intravenous Every 8 hours 12/02/16 0254 12/04/16 0907   12/01/16 2315  piperacillin-tazobactam (ZOSYN) IVPB 3.375 g     3.375 g 100 mL/hr over 30 Minutes Intravenous  Once 12/01/16 2310 12/02/16 0154   12/01/16 2315  vancomycin (VANCOCIN) IVPB 1000 mg/200 mL premix  Status:  Discontinued     1,000 mg 200 mL/hr over 60 Minutes Intravenous  Once 12/01/16 2310 12/01/16 2311   12/01/16 2315  vancomycin (VANCOCIN) 1,500 mg in sodium chloride 0.9 % 500 mL IVPB     1,500 mg 250 mL/hr over 120 Minutes Intravenous  Once 12/01/16 2311 12/02/16 0157      Medications:  Scheduled: . amLODipine  5 mg Oral Daily  . finasteride  5 mg Oral Daily  . fondaparinux (ARIXTRA) injection  2.5 mg Subcutaneous Q24H  . insulin aspart  0-5 Units Subcutaneous QHS  . insulin aspart  0-9 Units Subcutaneous TID WC  . insulin glargine  15 Units Subcutaneous Daily  . insulin starter kit- syringes  1 kit Other Once  . sodium chloride flush  3 mL Intravenous Q12H  . tamsulosin  0.4 mg Oral QPC supper    Objective: Vital signs in last 24 hours: Temp:  [97.6 F (36.4 C)-98.4 F (36.9 C)]  98.4 F (36.9 C) (04/23 1316) Pulse Rate:  [73-82] 82 (04/23 1316) Resp:  [17-18] 18 (04/23 0545) BP: (110-145)/(78-96) 145/96 (04/23 1316) SpO2:  [98 %-99 %] 99 % (04/23 1316)   General appearance: alert, cooperative and no distress Resp: clear to auscultation bilaterally Cardio: regular rate and rhythm GI: normal findings: bowel sounds normal and soft, non-tender  Lab Results  Recent Labs  12/03/16 0437 12/04/16 0608 12/05/16 0414  WBC 6.5 7.0  --   HGB 12.8* 13.9  --   HCT 38.5* 41.4  --   NA 135 134* 134*  K 4.6 3.9 4.0  CL 108 105 106  CO2 21* 20* 22  BUN 21* 18 16  CREATININE 1.32* 1.18 1.13   Liver Panel No results for input(s): PROT, ALBUMIN, AST, ALT, ALKPHOS, BILITOT, BILIDIR, IBILI in the last 72 hours. Sedimentation Rate No results for input(s): ESRSEDRATE in the last 72 hours. C-Reactive Protein No results for input(s): CRP in the last 72 hours.  Microbiology: Recent Results (from the past 240 hour(s))  Blood Culture (routine x 2)     Status: Abnormal   Collection Time: 12/01/16 11:14 PM  Result Value Ref Range Status   Specimen Description BLOOD LEFT WRIST  Final   Special Requests  Final    BOTTLES DRAWN AEROBIC AND ANAEROBIC Blood Culture adequate volume   Culture  Setup Time   Final    GRAM POSITIVE COCCI IN CLUSTERS IN BOTH AEROBIC AND ANAEROBIC BOTTLES CRITICAL RESULT CALLED TO, READ BACK BY AND VERIFIED WITH: J MILLEN PHARMD 2024 12/02/16 A BROWNING    Culture STAPHYLOCOCCUS SPECIES (COAGULASE NEGATIVE) (A)  Final   Report Status 12/05/2016 FINAL  Final   Organism ID, Bacteria STAPHYLOCOCCUS SPECIES (COAGULASE NEGATIVE)  Final      Susceptibility   Staphylococcus species (coagulase negative) - MIC*    CIPROFLOXACIN <=0.5 SENSITIVE Sensitive     ERYTHROMYCIN >=8 RESISTANT Resistant     GENTAMICIN <=0.5 SENSITIVE Sensitive     OXACILLIN <=0.25 SENSITIVE Sensitive     TETRACYCLINE <=1 SENSITIVE Sensitive     VANCOMYCIN 1 SENSITIVE Sensitive      TRIMETH/SULFA <=10 SENSITIVE Sensitive     CLINDAMYCIN <=0.25 SENSITIVE Sensitive     RIFAMPIN <=0.5 SENSITIVE Sensitive     Inducible Clindamycin NEGATIVE Sensitive     * STAPHYLOCOCCUS SPECIES (COAGULASE NEGATIVE)  Blood Culture ID Panel (Reflexed)     Status: Abnormal   Collection Time: 12/01/16 11:14 PM  Result Value Ref Range Status   Enterococcus species NOT DETECTED NOT DETECTED Final   Listeria monocytogenes NOT DETECTED NOT DETECTED Final   Staphylococcus species DETECTED (A) NOT DETECTED Final    Comment: Methicillin (oxacillin) susceptible coagulase negative staphylococcus. Possible blood culture contaminant (unless isolated from more than one blood culture draw or clinical case suggests pathogenicity). No antibiotic treatment is indicated for blood  culture contaminants. CRITICAL RESULT CALLED TO, READ BACK BY AND VERIFIED WITH: J MILLEN PHARMD 2024 12/02/16 A BROWNING    Staphylococcus aureus NOT DETECTED NOT DETECTED Final   Methicillin resistance NOT DETECTED NOT DETECTED Final   Streptococcus species NOT DETECTED NOT DETECTED Final   Streptococcus agalactiae NOT DETECTED NOT DETECTED Final   Streptococcus pneumoniae NOT DETECTED NOT DETECTED Final   Streptococcus pyogenes NOT DETECTED NOT DETECTED Final   Acinetobacter baumannii NOT DETECTED NOT DETECTED Final   Enterobacteriaceae species NOT DETECTED NOT DETECTED Final   Enterobacter cloacae complex NOT DETECTED NOT DETECTED Final   Escherichia coli NOT DETECTED NOT DETECTED Final   Klebsiella oxytoca NOT DETECTED NOT DETECTED Final   Klebsiella pneumoniae NOT DETECTED NOT DETECTED Final   Proteus species NOT DETECTED NOT DETECTED Final   Serratia marcescens NOT DETECTED NOT DETECTED Final   Haemophilus influenzae NOT DETECTED NOT DETECTED Final   Neisseria meningitidis NOT DETECTED NOT DETECTED Final   Pseudomonas aeruginosa NOT DETECTED NOT DETECTED Final   Candida albicans NOT DETECTED NOT DETECTED Final    Candida glabrata NOT DETECTED NOT DETECTED Final   Candida krusei NOT DETECTED NOT DETECTED Final   Candida parapsilosis NOT DETECTED NOT DETECTED Final   Candida tropicalis NOT DETECTED NOT DETECTED Final  Blood Culture (routine x 2)     Status: Abnormal   Collection Time: 12/01/16 11:15 PM  Result Value Ref Range Status   Specimen Description BLOOD RIGHT ARM  Final   Special Requests   Final    BOTTLES DRAWN AEROBIC AND ANAEROBIC Blood Culture adequate volume   Culture  Setup Time   Final    GRAM POSITIVE COCCI IN CLUSTERS IN BOTH AEROBIC AND ANAEROBIC BOTTLES CRITICAL RESULT CALLED TO, READ BACK BY AND VERIFIED WITH: J MILLEN PHARMD 2024 12/02/16 A BROWNING    Culture (A)  Final    STAPHYLOCOCCUS SPECIES (COAGULASE  NEGATIVE) SUSCEPTIBILITIES PERFORMED ON PREVIOUS CULTURE WITHIN THE LAST 5 DAYS.    Report Status 12/05/2016 FINAL  Final  Urine culture     Status: Abnormal   Collection Time: 12/01/16 11:38 PM  Result Value Ref Range Status   Specimen Description URINE, RANDOM  Final   Special Requests NONE  Final   Culture (A)  Final    >=100,000 COLONIES/mL STAPHYLOCOCCUS SPECIES (COAGULASE NEGATIVE)   Report Status 12/04/2016 FINAL  Final   Organism ID, Bacteria STAPHYLOCOCCUS SPECIES (COAGULASE NEGATIVE) (A)  Final      Susceptibility   Staphylococcus species (coagulase negative) - MIC*    CIPROFLOXACIN <=0.5 SENSITIVE Sensitive     GENTAMICIN <=0.5 SENSITIVE Sensitive     NITROFURANTOIN <=16 SENSITIVE Sensitive     OXACILLIN <=0.25 SENSITIVE Sensitive     TETRACYCLINE <=1 SENSITIVE Sensitive     VANCOMYCIN 1 SENSITIVE Sensitive     TRIMETH/SULFA <=10 SENSITIVE Sensitive     CLINDAMYCIN <=0.25 SENSITIVE Sensitive     RIFAMPIN <=0.5 SENSITIVE Sensitive     Inducible Clindamycin NEGATIVE Sensitive     * >=100,000 COLONIES/mL STAPHYLOCOCCUS SPECIES (COAGULASE NEGATIVE)  MRSA PCR Screening     Status: None   Collection Time: 12/02/16  6:36 AM  Result Value Ref Range Status     MRSA by PCR NEGATIVE NEGATIVE Final    Comment:        The GeneXpert MRSA Assay (FDA approved for NASAL specimens only), is one component of a comprehensive MRSA colonization surveillance program. It is not intended to diagnose MRSA infection nor to guide or monitor treatment for MRSA infections.   Culture, blood (routine x 2)     Status: None (Preliminary result)   Collection Time: 12/05/16  4:14 AM  Result Value Ref Range Status   Specimen Description BLOOD RIGHT HAND  Final   Special Requests   Final    BOTTLES DRAWN AEROBIC AND ANAEROBIC Blood Culture adequate volume   Culture NO GROWTH < 12 HOURS  Final   Report Status PENDING  Incomplete  Culture, blood (routine x 2)     Status: None (Preliminary result)   Collection Time: 12/05/16  6:19 AM  Result Value Ref Range Status   Specimen Description BLOOD RIGHT HAND  Final   Special Requests   Final    BOTTLES DRAWN AEROBIC ONLY Blood Culture adequate volume   Culture NO GROWTH < 12 HOURS  Final   Report Status PENDING  Incomplete    Studies/Results: No results found.   Assessment/Plan: Coag Neg Staph bacteremia/MSSE UCx MSSE DM2 Prev abd mesh Hydroureteronephrosis, bladder diverticulum  Total days of antibiotics: 3 ancef  Would aim for 14 days of ancef Would not pursue endocarditis w/u with bladder source Available as needed.   Allergies  Allergen Reactions  . Pork-Derived Products Other (See Comments)    Pt doesn't consume any pork products    Discharge antibiotics: Ancef 2g q8h IVPB Duration: 13 days End Date: Dec 18, 2016  [place] Per Protocol:  Labs weekly while on IV antibiotics: _xCBC with differential __ BMP _x_ CMP __ CRP __ ESR __ Vancomycin trough  _x_ Please pull PIC at completion of IV antibiotics __ Please leave PIC in place until doctor has seen patient or been notified  Fax weekly labs to [phone]  Clinic Follow Up Appt: PCP          B[facility] Rumpf Infectious  Diseases (pager) [phone] [email or web address] 12/05/2016, 4:50 PM  LOS: 3 days

## 2016-12-05 NOTE — Progress Notes (Addendum)
Inpatient Diabetes Program Recommendations  AACE/ADA: New Consensus Statement on Inpatient Glycemic Control (2015)  Target Ranges:  Prepandial:   less than 140 mg/dL      Peak postprandial:   less than 180 mg/dL (1-2 hours)      Critically ill patients:  140 - 180 mg/dL   Results for Daniel Porter, Daniel Porter (MRN [id number]) as of 12/05/2016 15:51  Ref. Range 12/04/2016 07:27 12/04/2016 11:57 12/04/2016 17:09 12/04/2016 22:20  Glucose-Capillary Latest Ref Range: 65 - 99 mg/dL 829 (H) 562 (H) 130 (H) 195 (H)   Results for Daniel Porter, Daniel Porter (MRN [id number]) as of 12/05/2016 15:51  Ref. Range 12/05/2016 08:10 12/05/2016 12:34  Glucose-Capillary Latest Ref Range: 65 - 99 mg/dL 295 (H) 284 (H)    Admit with: UTI/ Sepsis  History: DM2  Home DM Meds: None  Current Insulin Orders: Lantus 15 units daily                                       Novolog Sensitive Correction Scale/ SSI (0-9 units) TID AC + HS    Spoke with Dr. Waymon Amato today regarding discharge plans for this patient.  Per Dr. Waymon Amato, plans to send pt home tomorrow with insulin and possibly Metformin.    For ease of home use and to increase patient compliance, decision made to send pt home on Novolog 70/30 Mix insulin BID with meals +/- Metformin.  Per Dr. Waymon Amato, pt will likely skip fasting for the holy month of Ramadan so 70/30 insulin would be an OK option to take at home.  Based on pt's weight and current insulin requirements, recommend we convert pt to the following doses of Novolog 70/30 Mix insulin for discharge:  Novolog 70/30 Mix Insulin- 12 units BID with meals   [this dose would provide pt with ~17 units basal insulin throughout the day (patient has been getting Lantus 15 units daily in hospital) and ~3.5 units rapid-acting insulin with breakfast and supper]    Dr. Waymon Amato told me pt was concerned that the insulin has pork derivatives in it.  Called pharmacy and the [employer] drug rep (makes of Novolog 70/30 Mix  insulin).  Both the pharmacist and the [employer] rep told me that Novolog 70/30 Mix insulin does not have any pork derivatives in it.  Per the Novolog 70/30 Mix PI website: "NOVOLOG MIX 70/30 (insulin aspart protamine and insulin aspart injection) is a human insulin analog suspension containing 70% insulin aspart protamine crystals and 30% soluble insulin aspart. NOVOLOG MIX 70/30 is a blood-glucose-lowering agent with an earlier onset and an intermediate duration of action. Insulin aspart is homologous with regular human insulin with the exception of a single substitution of the amino acid proline by aspartic acid in position B28, and is produced by recombinant DNA technology utilizing Saccharomyces cerevisiae (baker's yeast)."    --Will follow patient during hospitalization--  Ambrose Finland RN, MSN, CDE Diabetes Coordinator Inpatient Glycemic Control Team Team Pager: [phone] (8a-5p)

## 2016-12-05 NOTE — Progress Notes (Signed)
PROGRESS NOTE   Daniel Porter  HYI:[id number]    DOB: 1941/01/01    DOA: 12/01/2016  PCP: No PCP Per Patient   I have briefly reviewed patients previous medical records in [facility].  Brief Narrative:  76 y/o widowed male, Arabic speaking, does not see a PCP, PMH of untreated DM, GERD, HTN, recent R Bells palsy and on Prednisone & Valtrex, recurrent UIT's in the past 2 years, presented to the ED after his family called EMS d/t shaking chills and AMS. Septic on admission. Urology consulted and S/P Foley for R hydronephrosis due to bladder & R bladder diverticulum distension. Admitted to Step down. Blood culture and Urine culture >100 K confirm coagulase negative staph. Infectious disease consulted 4/22 and changed antibiotics to IV Ancef, final recommendations pending.   Assessment & Plan:   Principal Problem:   Pyohydronephrosis Active Problems:   DM2 (diabetes mellitus, type 2) (HCC)   Sepsis secondary to UTI (HCC)   Bladder mass   CKD (chronic kidney disease) stage 3, GFR 30-59 ml/min   1. Sepsis d/t coagulase negative Staphylococcus complicated UTI and bacteremia: Treated per sepsis protocol with aggressive IV fluids and IV Vancomycin & Zosyn (Prior enterococcal & E Coli UTI). Sepsis physiology resolved. ID consulted. Vancomycin and Zosyn discontinued. Surveillance blood cultures drawn 4/22 & pending. IV Ancef started. HIV and hepatitis C antibody: Pending. Await ID follow-up and final recommendations. 2. Right Hydronephrosis/hydroureter: likely due to bladder and right bladder diverticulum distention. Urology consulted> Foley catheter placed and 1100 ml urine drained initially. 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 Foley catheter discontinued 4/22 and patient voiding well. As per Dr. Eskridge/urology, he will arrange outpatient follow-up for cystoscopy/renal ultrasound as  outpatient. 3. 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 Urology will arrange outpatient follow-up. Continue tamsulosin and finasteride on discharge. 4. Acute encephalopathy: Likely related to sepsis. No new focal deficits. Resolved. 5. Thrombocytopenia:? Related to sepsis. Follow CBCs. No bleeding reported. Stable. Follow CBC 4/24. 6. Hyponatremia: Due to dehydration and hyperglycemia. Resolved 7. Stage 3 CKD: Creatinine has normalized. 8. Uncontrolled/untreated DM2: stated home CBG's ranged from 180-400 and drinks sweat drinks nightly. Also precipitated by recent steroids.Diabetes coordinator consultation appreciated and discussed with them 4/20 & 4/23. Hemoglobin A1c: 10.2. Increased Lantus 15 units subcutaneously daily and continue SSI. Monitor closely. Better controlled. Long discussion with patient and son at bedside and patient now agreeable to insulin at discharge. As discussed with diabetes coordinator, 70/30 insulin bid +/- Metformin may be best for compliance issues. Also patient states that he may skip fasting during the holy month of Ramadan next month. Case management has arranged PCP follow-up on 12/09/16. 9. Essential hypertension: Mildly uncontrolled. Started amlodipine 5 MG daily. Reluctant to start ACEI/ARB due to recent acute kidney injury and question about compliance to M.D. follow-ups and lab draws. Better. 10. Recent right-sided Bell's palsy: Apparently occurred 2 weeks ago. Didn't fill one of the meds ? antiviral   DVT prophylaxis: Arixtra Code Status: Full Family Communication: Discussed in detail with patient's son at bedside. Disposition: DC home when medically stable. Transfer to medical bed 4/21. Possible discharge home 4/24 pending ID input regarding choice of antibiotics & DM/Insulin education. If IV Abx recommended, will need PICC line placed.   Consultants:  Urology  Infectious disease  Procedures:    Foley catheter-DC'd 4/22  Antimicrobials:  IV Zosyn-discontinued 4/22. IV vancomycin > discontinued  4/22 IV Ancef 4/23 >   Subjective: Patient's son at bedside helped interpret. Foley catheter removed yesterday. Voiding well without difficulty. Frustrated that he is not getting to eat what he wants to. Overall feels better and has no specific complaints. As per RN, no acute issues reported.  ROS: No chest pain or dyspnea.   Objective:  Vitals:   12/04/16 2143 12/05/16 0545 12/05/16 1053 12/05/16 1316  BP: 110/86 (!) 143/80 128/78 (!) 145/96  Pulse: 79 73 77 82  Resp: 17 18    Temp: 98.2 F (36.8 C) 97.6 F (36.4 C)  98.4 F (36.9 C)  TempSrc: Oral Oral  Oral  SpO2: 98% 99% 99% 99%  Weight:      Height:        Examination:  General exam: Pleasant elderly male, moderately built and nourished sitting up in chair eating breakfast. Respiratory system: Clear to auscultation. Respiratory effort normal. Cardiovascular system: S1 & S2 heard, RRR. No JVD, murmurs, rubs, gallops or clicks. No pedal edema. Gastrointestinal system: Abdomen is nondistended, soft and nontender. No organomegaly or masses felt. Normal bowel sounds heard. Subumbilical reducible uncomplicated ventral hernia. Central nervous system: Alert and oriented. Old LMN facial palsy/Bell's palsy. No dysarthria. Extremities: Symmetric 5 x 5 power. Skin: No rashes, lesions or ulcers Psychiatry: Judgement and insight appear normal. Mood & affect appropriate.  Musculoskeletal system: No acute findings on neck exam.    Data Reviewed: I have personally reviewed following labs and imaging studies  CBC:  Recent Labs Lab 12/01/16 2310 12/01/16 2330 12/03/16 0437 12/04/16 0608  WBC 12.0*  --  6.5 7.0  NEUTROABS 10.7*  --   --   --   HGB 15.1 15.6 12.8* 13.9  HCT 43.2 46.0 38.5* 41.4  MCV 81.7  --  83.5 83.0  PLT 132*  --  128* 830*   Basic Metabolic Panel:  Recent Labs Lab 12/01/16 2310 12/01/16 2330  12/03/16 0437 12/04/16 0608 12/05/16 0414  NA 131* 134* 135 134* 134*  K 4.0 4.1 4.6 3.9 4.0  CL 99* 99* 108 105 106  CO2 21*  --  21* 20* 22  GLUCOSE 384* 395* 225* 181* 186*  BUN 27* 29* 21* 18 16  CREATININE 1.48* 1.40* 1.32* 1.18 1.13  CALCIUM 10.0  --  9.1 9.6 9.8   Liver Function Tests:  Recent Labs Lab 12/01/16 2310  AST 29  ALT 32  ALKPHOS 124  BILITOT 1.1  PROT 7.1  ALBUMIN 3.5   Coagulation Profile:  Recent Labs Lab 12/01/16 2310  INR 1.08   CBG:  Recent Labs Lab 12/04/16 1157 12/04/16 1709 12/04/16 2220 12/05/16 0810 12/05/16 1234  GLUCAP 237* 242* 195* 164* 186*    Recent Results (from the past 240 hour(s))  Blood Culture (routine x 2)     Status: Abnormal   Collection Time: 12/01/16 11:14 PM  Result Value Ref Range Status   Specimen Description BLOOD LEFT WRIST  Final   Special Requests   Final    BOTTLES DRAWN AEROBIC AND ANAEROBIC Blood Culture adequate volume   Culture  Setup Time   Final    GRAM POSITIVE COCCI IN CLUSTERS IN BOTH AEROBIC AND ANAEROBIC BOTTLES CRITICAL RESULT CALLED TO, READ BACK BY AND VERIFIED WITH: J MILLEN PHARMD 2024 12/02/16 A BROWNING    Culture STAPHYLOCOCCUS SPECIES (COAGULASE NEGATIVE) (A)  Final   Report Status 12/05/2016 FINAL  Final   Organism ID, Bacteria STAPHYLOCOCCUS SPECIES (COAGULASE NEGATIVE)  Final  Susceptibility   Staphylococcus species (coagulase negative) - MIC*    CIPROFLOXACIN <=0.5 SENSITIVE Sensitive     ERYTHROMYCIN >=8 RESISTANT Resistant     GENTAMICIN <=0.5 SENSITIVE Sensitive     OXACILLIN <=0.25 SENSITIVE Sensitive     TETRACYCLINE <=1 SENSITIVE Sensitive     VANCOMYCIN 1 SENSITIVE Sensitive     TRIMETH/SULFA <=10 SENSITIVE Sensitive     CLINDAMYCIN <=0.25 SENSITIVE Sensitive     RIFAMPIN <=0.5 SENSITIVE Sensitive     Inducible Clindamycin NEGATIVE Sensitive     * STAPHYLOCOCCUS SPECIES (COAGULASE NEGATIVE)  Blood Culture ID Panel (Reflexed)     Status: Abnormal   Collection  Time: 12/01/16 11:14 PM  Result Value Ref Range Status   Enterococcus species NOT DETECTED NOT DETECTED Final   Listeria monocytogenes NOT DETECTED NOT DETECTED Final   Staphylococcus species DETECTED (A) NOT DETECTED Final    Comment: Methicillin (oxacillin) susceptible coagulase negative staphylococcus. Possible blood culture contaminant (unless isolated from more than one blood culture draw or clinical case suggests pathogenicity). No antibiotic treatment is indicated for blood  culture contaminants. CRITICAL RESULT CALLED TO, READ BACK BY AND VERIFIED WITH: J MILLEN PHARMD 2024 12/02/16 A BROWNING    Staphylococcus aureus NOT DETECTED NOT DETECTED Final   Methicillin resistance NOT DETECTED NOT DETECTED Final   Streptococcus species NOT DETECTED NOT DETECTED Final   Streptococcus agalactiae NOT DETECTED NOT DETECTED Final   Streptococcus pneumoniae NOT DETECTED NOT DETECTED Final   Streptococcus pyogenes NOT DETECTED NOT DETECTED Final   Acinetobacter baumannii NOT DETECTED NOT DETECTED Final   Enterobacteriaceae species NOT DETECTED NOT DETECTED Final   Enterobacter cloacae complex NOT DETECTED NOT DETECTED Final   Escherichia coli NOT DETECTED NOT DETECTED Final   Klebsiella oxytoca NOT DETECTED NOT DETECTED Final   Klebsiella pneumoniae NOT DETECTED NOT DETECTED Final   Proteus species NOT DETECTED NOT DETECTED Final   Serratia marcescens NOT DETECTED NOT DETECTED Final   Haemophilus influenzae NOT DETECTED NOT DETECTED Final   Neisseria meningitidis NOT DETECTED NOT DETECTED Final   Pseudomonas aeruginosa NOT DETECTED NOT DETECTED Final   Candida albicans NOT DETECTED NOT DETECTED Final   Candida glabrata NOT DETECTED NOT DETECTED Final   Candida krusei NOT DETECTED NOT DETECTED Final   Candida parapsilosis NOT DETECTED NOT DETECTED Final   Candida tropicalis NOT DETECTED NOT DETECTED Final  Blood Culture (routine x 2)     Status: Abnormal   Collection Time: 12/01/16 11:15 PM    Result Value Ref Range Status   Specimen Description BLOOD RIGHT ARM  Final   Special Requests   Final    BOTTLES DRAWN AEROBIC AND ANAEROBIC Blood Culture adequate volume   Culture  Setup Time   Final    GRAM POSITIVE COCCI IN CLUSTERS IN BOTH AEROBIC AND ANAEROBIC BOTTLES CRITICAL RESULT CALLED TO, READ BACK BY AND VERIFIED WITH: J MILLEN PHARMD 2024 12/02/16 A BROWNING    Culture (A)  Final    STAPHYLOCOCCUS SPECIES (COAGULASE NEGATIVE) SUSCEPTIBILITIES PERFORMED ON PREVIOUS CULTURE WITHIN THE LAST 5 DAYS.    Report Status 12/05/2016 FINAL  Final  Urine culture     Status: Abnormal   Collection Time: 12/01/16 11:38 PM  Result Value Ref Range Status   Specimen Description URINE, RANDOM  Final   Special Requests NONE  Final   Culture (A)  Final    >=100,000 COLONIES/mL STAPHYLOCOCCUS SPECIES (COAGULASE NEGATIVE)   Report Status 12/04/2016 FINAL  Final   Organism ID, Bacteria STAPHYLOCOCCUS SPECIES (  COAGULASE NEGATIVE) (A)  Final      Susceptibility   Staphylococcus species (coagulase negative) - MIC*    CIPROFLOXACIN <=0.5 SENSITIVE Sensitive     GENTAMICIN <=0.5 SENSITIVE Sensitive     NITROFURANTOIN <=16 SENSITIVE Sensitive     OXACILLIN <=0.25 SENSITIVE Sensitive     TETRACYCLINE <=1 SENSITIVE Sensitive     VANCOMYCIN 1 SENSITIVE Sensitive     TRIMETH/SULFA <=10 SENSITIVE Sensitive     CLINDAMYCIN <=0.25 SENSITIVE Sensitive     RIFAMPIN <=0.5 SENSITIVE Sensitive     Inducible Clindamycin NEGATIVE Sensitive     * >=100,000 COLONIES/mL STAPHYLOCOCCUS SPECIES (COAGULASE NEGATIVE)  MRSA PCR Screening     Status: None   Collection Time: 12/02/16  6:36 AM  Result Value Ref Range Status   MRSA by PCR NEGATIVE NEGATIVE Final    Comment:        The GeneXpert MRSA Assay (FDA approved for NASAL specimens only), is one component of a comprehensive MRSA colonization surveillance program. It is not intended to diagnose MRSA infection nor to guide or monitor treatment for MRSA  infections.          Radiology Studies: No results found.      Scheduled Meds: . amLODipine  5 mg Oral Daily  . finasteride  5 mg Oral Daily  . fondaparinux (ARIXTRA) injection  2.5 mg Subcutaneous Q24H  . insulin aspart  0-5 Units Subcutaneous QHS  . insulin aspart  0-9 Units Subcutaneous TID WC  . insulin glargine  15 Units Subcutaneous Daily  . insulin starter kit- syringes  1 kit Other Once  . sodium chloride flush  3 mL Intravenous Q12H  . tamsulosin  0.4 mg Oral QPC supper   Continuous Infusions: .  ceFAZolin (ANCEF) IV Stopped (12/05/16 3013)     LOS: 3 days     Ara Grandmaison, MD, FACP, FHM. [facility] Pager [phone]  If 7PM-7AM, please contact night-coverage [email or web address] Password [facility] 12/05/2016, 2:08 PM

## 2016-12-05 NOTE — Progress Notes (Signed)
Insulin administration education done with pt. And his son. Will continue to show pt. And his family how to administer insulin. Food education done as well.

## 2016-12-06 DIAGNOSIS — N390 Urinary tract infection, site not specified: Secondary | ICD-10-CM | POA: Diagnosis not present

## 2016-12-06 DIAGNOSIS — N136 Pyonephrosis: Secondary | ICD-10-CM | POA: Diagnosis not present

## 2016-12-06 DIAGNOSIS — A411 Sepsis due to other specified staphylococcus: Secondary | ICD-10-CM | POA: Diagnosis not present

## 2016-12-06 DIAGNOSIS — N3001 Acute cystitis with hematuria: Secondary | ICD-10-CM | POA: Diagnosis not present

## 2016-12-06 DIAGNOSIS — A419 Sepsis, unspecified organism: Secondary | ICD-10-CM | POA: Diagnosis not present

## 2016-12-06 LAB — BASIC METABOLIC PANEL
Anion gap: 8 (ref 5–15)
BUN: 19 mg/dL (ref 6–20)
CHLORIDE: 104 mmol/L (ref 101–111)
CO2: 22 mmol/L (ref 22–32)
CREATININE: 1.22 mg/dL (ref 0.61–1.24)
Calcium: 9.8 mg/dL (ref 8.9–10.3)
GFR calc Af Amer: >60 mL/min (ref >60)
GFR calc non Af Amer: 56 mL/min — ABNORMAL LOW (ref >60)
Glucose, Bld: 172 mg/dL — ABNORMAL HIGH (ref 65–99)
Potassium: 3.8 mmol/L (ref 3.5–5.1)
Sodium: 134 mmol/L — ABNORMAL LOW (ref 135–145)

## 2016-12-06 LAB — CBC
HEMATOCRIT: 40.7 % (ref 39.0–52.0)
HEMOGLOBIN: 13.7 g/dL (ref 13.0–17.0)
MCH: 27.8 pg (ref 26.0–34.0)
MCHC: 33.7 g/dL (ref 30.0–36.0)
MCV: 82.7 fL (ref 78.0–100.0)
Platelets: 145 10*3/uL — ABNORMAL LOW (ref 150–400)
RBC: 4.92 MIL/uL (ref 4.22–5.81)
RDW: 13.8 % (ref 11.5–15.5)
WBC: 8 10*3/uL (ref 4.0–10.5)

## 2016-12-06 LAB — GLUCOSE, CAPILLARY
GLUCOSE-CAPILLARY: 105 mg/dL — AB (ref 65–99)
GLUCOSE-CAPILLARY: 238 mg/dL — AB (ref 65–99)
Glucose-Capillary: 154 mg/dL — ABNORMAL HIGH (ref 65–99)
Glucose-Capillary: 219 mg/dL — ABNORMAL HIGH (ref 65–99)
Glucose-Capillary: 234 mg/dL — ABNORMAL HIGH (ref 65–99)

## 2016-12-06 LAB — HEPATITIS C ANTIBODY (REFLEX): HCV Ab: <0.1 s/co ratio (ref 0.0–0.9)

## 2016-12-06 LAB — HCV COMMENT:

## 2016-12-06 MED ORDER — CEFAZOLIN IV (FOR PTA / DISCHARGE USE ONLY): 2.0000 g | Freq: Three times a day (TID) | INTRAVENOUS | 0 refills | Status: DC

## 2016-12-06 NOTE — Progress Notes (Signed)
Spoke with patient and son again this AM (son helped me to translate to patient when pt did not understand certain words).  Discussed with pt and son that Dr. Waymon Amato plans to send pt home on insulin to help with blood sugar control.  Explained what 70/30 Insulin is, how to take, when to take, etc.  Instructed pt and son that pt will need to eat a meal when he takes the insulin.  Needs to check his CBGs prior to meals.  Also discussed low blood sugar signs, symptoms, and treatment with pt and son.  RNs have been working with pt and son to learn how to draw up and give insulin injections.  Reminded pt and son that pt must rotate injection spots to minimize risk for scar tissue development.    MD- Please make sure to give patient Rxs for Novolog 70/30 Insulin + Insulin syringes at time of discharge.  Novolog 70/30 Mix- Order # [id number]  Insulin Syringes- Order # [id number]   Based on pt's weight and current insulin requirements, recommend we convert pt to the following doses of Novolog 70/30 Mix insulin for discharge:  Novolog 70/30 Mix Insulin- 12 units BID with meals    --Will follow patient during hospitalization--  Ambrose Finland RN, MSN, CDE Diabetes Coordinator Inpatient Glycemic Control Team Team Pager: [phone] (8a-5p)

## 2016-12-06 NOTE — Progress Notes (Signed)
[facility]  New pt for [facility] this hospital admission.  [facility] will be providing HHRN and Home Infusion Pharmacy services for home IV Cefazolin upon DC to home.  [facility] will provide in hospital teaching with the patient's son regarding IV ABX administration to support independence at home.  If patient discharges after hours, please call [phone].   [name] 12/06/2016, 11:04 AM

## 2016-12-06 NOTE — Progress Notes (Signed)
MD made aware that patient's IV infiltrated and was removed. Per MD, we can leave out for now because patient may discharge later today. Will re-address if patient does not discharge.

## 2016-12-06 NOTE — Progress Notes (Signed)
RN reviewed, in detail, the insulin starter kit. Arabic interpretor utilized. Patient successfully demonstrated how to draw up and self administer insulin.

## 2016-12-06 NOTE — Progress Notes (Addendum)
PROGRESS NOTE   Daniel Porter  CWC:[id number]    DOB: 05/02/1941    DOA: 12/01/2016  PCP: No PCP Per Patient   I have briefly reviewed patients previous medical records in [facility].  Brief Narrative:  76 y/o widowed male, Arabic speaking, does not see a PCP, PMH of untreated DM, GERD, HTN, recent R Bells palsy and on Prednisone & Valtrex, recurrent UIT's in the past 2 years, presented to the ED after his family called EMS d/t shaking chills and AMS. Septic on admission. Urology consulted and S/P Foley for R hydronephrosis due to bladder & R bladder diverticulum distension. Admitted to Step down. Blood culture and Urine culture >100 K confirm coagulase negative staph. Infectious disease consulted 4/22 and changed antibiotics to IV Ancef, Seen by ID who is recommending total of 14 days of IV Ancef  Assessment & Plan:   Principal Problem:   Pyohydronephrosis Active Problems:   DM2 (diabetes mellitus, type 2) (HCC)   Sepsis secondary to UTI (HCC)   Bladder mass   CKD (chronic kidney disease) stage 3, GFR 30-59 ml/min   1. Sepsis d/t coagulase negative Staphylococcus complicated UTI and bacteremia: Patient met sepsis criteria on admission, treated with sepsis protocol with IV fluids, broad-spectrum IV antibiotics including vancomycin and Zosyn (Prior enterococcal & E Coli UTI). Sepsis physiology resolved. ID consult greatly appreciated, recommendation is for IV Ancef total of 14 days, so PICC line requested, plan to discharge tomorrow, case management consulted to arrange for home care for prolonged IV antibiotic therapy at home . HIV and hepatitis C antibody: Nonreactive 2. Right Hydronephrosis/hydroureter: likely due to bladder and right bladder diverticulum distention. Urology consulted> Foley catheter placed and 1100 ml urine drained initially. 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 Foley  catheter discontinued 4/22 and patient voiding well. As per Dr. Eskridge/urology, he will arrange outpatient follow-up for cystoscopy/renal ultrasound as outpatient. 3. 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 4. Acute encephalopathy: Likely related to sepsis. No new focal deficits. Resolved, mentation back to baseline 5. Thrombocytopenia:?  Most likely related to sepsis, improving, Hyponatremia: Due to dehydration and hyperglycemia. Resolved 6. Stage 3 CKD: Creatinine has normalized. 7. Uncontrolled/untreated DM2: stated home CBG's ranged from 180-400 and drinks sweat drinks nightly. Hemoglobin A1c is 10.2, uncontrolled, he is on Lantus during hospital stay, plan to discharge tomorrow on insulin 70/30, 12 units twice a day, he was educated by nursing staff, and diabetic coordinator(please see prescription numbers given by diabetic coordinator to be given to patients before discharge tomorrow) 8. Essential hypertension: Mildly uncontrolled. Started amlodipine 5 MG daily. Reluctant to start ACEI/ARB due to recent acute kidney injury and question about compliance to M.D. follow-ups and lab draws. Better. 9. Recent right-sided Bell's palsy: Apparently occurred 2 weeks ago. Didn't fill one of the meds ? antiviral   DVT prophylaxis: Arixtra Code Status: Full Family Communication: Discussed in detail with patient and  son at bedside. Disposition: DC home home tomorrow after PICC line is placed  Consultants:  Urology  Infectious disease  Procedures:  Foley catheter-DC'd 4/22  Antimicrobials:  IV Zosyn-discontinued 4/22. IV vancomycin > discontinued 4/22 IV Ancef 4/23 >   Subjective:  No chest pain or dyspnea. No feverver or chills,  no  further urinary retention.  Objective:  Vitals:   12/05/16 1316 12/05/16 2107 12/06/16 0603 12/06/16 1430  BP: (!) 145/96 (!) 171/81 (!) 156/87 139/89  Pulse: 82 76 64 75  Resp:  18 17   Temp: 98.4 F (36.9 C) 97.8 F (36.6 C) 97.8 F (36.6 C) 99 F (37.2 C)  TempSrc: Oral Oral Oral Oral  SpO2: 99% 98% 98% 98%  Weight:      Height:        Examination:  General exam: Pleasant elderly male, moderately built and nourished sitting up in chair eating breakfast. Respiratory system: Clear to auscultation. Respiratory effort normal. Cardiovascular system: S1 & S2 heard, RRR. No JVD, murmurs, rubs, gallops or clicks. No pedal edema.  Gastrointestinal system: Abdomen is nondistended, soft and nontender. No organomegaly or masses felt. Normal bowel sounds heard. Subumbilical reducible uncomplicated ventral hernia. Central nervous system: Alert and oriented. Old LMN facial palsy/Bell's palsy. No dysarthria. Extremities: Symmetric 5 x 5 power. Skin: No rashes, lesions or ulcers Psychiatry: Judgement and insight appear normal. Mood & affect appropriate.  Musculoskeletal system: No acute findings on neck exam.    Data Reviewed: I have personally reviewed following labs and imaging studies  CBC:  Recent Labs Lab 12/01/16 2310 12/01/16 2330 12/03/16 0437 12/04/16 0608 12/06/16 0409  WBC 12.0*  --  6.5 7.0 8.0  NEUTROABS 10.7*  --   --   --   --   HGB 15.1 15.6 12.8* 13.9 13.7  HCT 43.2 46.0 38.5* 41.4 40.7  MCV 81.7  --  83.5 83.0 82.7  PLT 132*  --  128* 128* 277*   Basic Metabolic Panel:  Recent Labs Lab 12/01/16 2310 12/01/16 2330 12/03/16 0437 12/04/16 0608 12/05/16 0414 12/06/16 0409  NA 131* 134* 135 134* 134* 134*  K 4.0 4.1 4.6 3.9 4.0 3.8  CL 99* 99* 108 105 106 104  CO2 21*  --  21* 20* 22 22  GLUCOSE 384* 395* 225* 181* 186* 172*  BUN 27* 29* 21* _0 CREATININE 1.48* 1.40* 1.32* 1.18 1.13 1.22  CALCIUM 10.0  --  9.1 9.6 9.8 9.8   Liver Function  Tests:  Recent Labs Lab 12/01/16 2310  AST 29  ALT 32  ALKPHOS 124  BILITOT 1.1  PROT 7.1  ALBUMIN 3.5   Coagulation Profile:  Recent Labs Lab 12/01/16 2310  INR 1.08   CBG:  Recent Labs Lab 12/05/16 1722 12/05/16 2106 12/06/16 0808 12/06/16 1153 12/06/16 1703  GLUCAP 218* 195* 154* 219* 105*    Recent Results (from the past 240 hour(s))  Blood Culture (routine x 2)     Status: Abnormal   Collection Time: 12/01/16 11:14 PM  Result Value Ref Range Status   Specimen Description BLOOD LEFT WRIST  Final   Special Requests   Final    BOTTLES DRAWN AEROBIC AND ANAEROBIC Blood Culture adequate volume   Culture  Setup Time   Final    GRAM POSITIVE COCCI IN CLUSTERS IN BOTH AEROBIC AND ANAEROBIC BOTTLES CRITICAL RESULT CALLED TO, READ BACK BY AND VERIFIED WITH: J MILLEN PHARMD 2024 12/02/16 A BROWNING    Culture STAPHYLOCOCCUS SPECIES (COAGULASE NEGATIVE) (A)  Final   Report Status 12/05/2016 FINAL  Final   Organism ID, Bacteria STAPHYLOCOCCUS SPECIES (COAGULASE NEGATIVE)  Final      Susceptibility   Staphylococcus species (coagulase negative) - MIC*    CIPROFLOXACIN <=0.5 SENSITIVE Sensitive     ERYTHROMYCIN >=8 RESISTANT Resistant  GENTAMICIN <=0.5 SENSITIVE Sensitive     OXACILLIN <=0.25 SENSITIVE Sensitive     TETRACYCLINE <=1 SENSITIVE Sensitive     VANCOMYCIN 1 SENSITIVE Sensitive     TRIMETH/SULFA <=10 SENSITIVE Sensitive     CLINDAMYCIN <=0.25 SENSITIVE Sensitive     RIFAMPIN <=0.5 SENSITIVE Sensitive     Inducible Clindamycin NEGATIVE Sensitive     * STAPHYLOCOCCUS SPECIES (COAGULASE NEGATIVE)  Blood Culture ID Panel (Reflexed)     Status: Abnormal   Collection Time: 12/01/16 11:14 PM  Result Value Ref Range Status   Enterococcus species NOT DETECTED NOT DETECTED Final   Listeria monocytogenes NOT DETECTED NOT DETECTED Final   Staphylococcus species DETECTED (A) NOT DETECTED Final    Comment: Methicillin (oxacillin) susceptible coagulase negative  staphylococcus. Possible blood culture contaminant (unless isolated from more than one blood culture draw or clinical case suggests pathogenicity). No antibiotic treatment is indicated for blood  culture contaminants. CRITICAL RESULT CALLED TO, READ BACK BY AND VERIFIED WITH: J MILLEN PHARMD 2024 12/02/16 A BROWNING    Staphylococcus aureus NOT DETECTED NOT DETECTED Final   Methicillin resistance NOT DETECTED NOT DETECTED Final   Streptococcus species NOT DETECTED NOT DETECTED Final   Streptococcus agalactiae NOT DETECTED NOT DETECTED Final   Streptococcus pneumoniae NOT DETECTED NOT DETECTED Final   Streptococcus pyogenes NOT DETECTED NOT DETECTED Final   Acinetobacter baumannii NOT DETECTED NOT DETECTED Final   Enterobacteriaceae species NOT DETECTED NOT DETECTED Final   Enterobacter cloacae complex NOT DETECTED NOT DETECTED Final   Escherichia coli NOT DETECTED NOT DETECTED Final   Klebsiella oxytoca NOT DETECTED NOT DETECTED Final   Klebsiella pneumoniae NOT DETECTED NOT DETECTED Final   Proteus species NOT DETECTED NOT DETECTED Final   Serratia marcescens NOT DETECTED NOT DETECTED Final   Haemophilus influenzae NOT DETECTED NOT DETECTED Final   Neisseria meningitidis NOT DETECTED NOT DETECTED Final   Pseudomonas aeruginosa NOT DETECTED NOT DETECTED Final   Candida albicans NOT DETECTED NOT DETECTED Final   Candida glabrata NOT DETECTED NOT DETECTED Final   Candida krusei NOT DETECTED NOT DETECTED Final   Candida parapsilosis NOT DETECTED NOT DETECTED Final   Candida tropicalis NOT DETECTED NOT DETECTED Final  Blood Culture (routine x 2)     Status: Abnormal   Collection Time: 12/01/16 11:15 PM  Result Value Ref Range Status   Specimen Description BLOOD RIGHT ARM  Final   Special Requests   Final    BOTTLES DRAWN AEROBIC AND ANAEROBIC Blood Culture adequate volume   Culture  Setup Time   Final    GRAM POSITIVE COCCI IN CLUSTERS IN BOTH AEROBIC AND ANAEROBIC BOTTLES CRITICAL  RESULT CALLED TO, READ BACK BY AND VERIFIED WITH: J MILLEN PHARMD 2024 12/02/16 A BROWNING    Culture (A)  Final    STAPHYLOCOCCUS SPECIES (COAGULASE NEGATIVE) SUSCEPTIBILITIES PERFORMED ON PREVIOUS CULTURE WITHIN THE LAST 5 DAYS.    Report Status 12/05/2016 FINAL  Final  Urine culture     Status: Abnormal   Collection Time: 12/01/16 11:38 PM  Result Value Ref Range Status   Specimen Description URINE, RANDOM  Final   Special Requests NONE  Final   Culture (A)  Final    >=100,000 COLONIES/mL STAPHYLOCOCCUS SPECIES (COAGULASE NEGATIVE)   Report Status 12/04/2016 FINAL  Final   Organism ID, Bacteria STAPHYLOCOCCUS SPECIES (COAGULASE NEGATIVE) (A)  Final      Susceptibility   Staphylococcus species (coagulase negative) - MIC*    CIPROFLOXACIN <=0.5 SENSITIVE Sensitive  GENTAMICIN <=0.5 SENSITIVE Sensitive     NITROFURANTOIN <=16 SENSITIVE Sensitive     OXACILLIN <=0.25 SENSITIVE Sensitive     TETRACYCLINE <=1 SENSITIVE Sensitive     VANCOMYCIN 1 SENSITIVE Sensitive     TRIMETH/SULFA <=10 SENSITIVE Sensitive     CLINDAMYCIN <=0.25 SENSITIVE Sensitive     RIFAMPIN <=0.5 SENSITIVE Sensitive     Inducible Clindamycin NEGATIVE Sensitive     * >=100,000 COLONIES/mL STAPHYLOCOCCUS SPECIES (COAGULASE NEGATIVE)  MRSA PCR Screening     Status: None   Collection Time: 12/02/16  6:36 AM  Result Value Ref Range Status   MRSA by PCR NEGATIVE NEGATIVE Final    Comment:        The GeneXpert MRSA Assay (FDA approved for NASAL specimens only), is one component of a comprehensive MRSA colonization surveillance program. It is not intended to diagnose MRSA infection nor to guide or monitor treatment for MRSA infections.   Culture, blood (routine x 2)     Status: None (Preliminary result)   Collection Time: 12/05/16  4:14 AM  Result Value Ref Range Status   Specimen Description BLOOD RIGHT HAND  Final   Special Requests   Final    BOTTLES DRAWN AEROBIC AND ANAEROBIC Blood Culture adequate  volume   Culture NO GROWTH 1 DAY  Final   Report Status PENDING  Incomplete  Culture, blood (routine x 2)     Status: None (Preliminary result)   Collection Time: 12/05/16  6:19 AM  Result Value Ref Range Status   Specimen Description BLOOD RIGHT HAND  Final   Special Requests   Final    BOTTLES DRAWN AEROBIC ONLY Blood Culture adequate volume   Culture NO GROWTH 1 DAY  Final   Report Status PENDING  Incomplete         Radiology Studies: No results found.      Scheduled Meds: . amLODipine  5 mg Oral Daily  . finasteride  5 mg Oral Daily  . fondaparinux (ARIXTRA) injection  2.5 mg Subcutaneous Q24H  . insulin aspart  0-5 Units Subcutaneous QHS  . insulin aspart  0-9 Units Subcutaneous TID WC  . insulin glargine  15 Units Subcutaneous Daily  . sodium chloride flush  3 mL Intravenous Q12H  . tamsulosin  0.4 mg Oral QPC supper   Continuous Infusions: .  ceFAZolin (ANCEF) IV Stopped (12/06/16 0786)     LOS: 4 days     Phillips Climes, MD,  [facility] Pager [phone]  If 7PM-7AM, please contact night-coverage [email or web address] Password [facility] 12/06/2016, 5:21 PM

## 2016-12-06 NOTE — Progress Notes (Signed)
Inpatient Diabetes Program Recommendations  AACE/ADA: New Consensus Statement on Inpatient Glycemic Control (2015)  Target Ranges:  Prepandial:   less than 140 mg/dL      Peak postprandial:   less than 180 mg/dL (1-2 hours)      Critically ill patients:  140 - 180 mg/dL   Results for Daniel Porter, Daniel Porter (MRN [id number]) as of 12/06/2016 08:20  Ref. Range 12/05/2016 08:10 12/05/2016 12:34 12/05/2016 14:19 12/05/2016 17:22 12/05/2016 21:06  Glucose-Capillary Latest Ref Range: 65 - 99 mg/dL 865 (H) 784 (H) 696 (H) 218 (H) 195 (H)    Admit with: UTI/ Sepsis  History: DM2  Home DM Meds: None  Current Insulin Orders: Lantus 15 units daily Novolog Sensitive Correction Scale/ SSI (0-9 units) TID AC + HS    Spoke with Dr. Waymon Amato yesterday (04/23) regarding discharge plans for this patient.  Per Dr. Waymon Amato, plans to send pt home with insulin and possibly Metformin.    For ease of home use and to increase patient compliance, decision made to send pt home on Novolog 70/30 Mix insulin BID with meals +/- Metformin.  Per Dr. Waymon Amato, pt will likely skip fasting for the holy month of Ramadan so 70/30 insulin would be an OK option to take at home.  Based on pt's weight and current insulin requirements, recommend we convert pt to the following doses of Novolog 70/30 Mix insulin for discharge:  Novolog 70/30 Mix Insulin- 12 units BID with meals   [this dose would provide pt with ~17 units basal insulin throughout the day (patient has been getting Lantus 15 units daily in hospital) and ~3.5 units rapid-acting insulin with breakfast and supper]    Dr. Waymon Amato told me pt was concerned that the insulin has pork derivatives in it.  Called pharmacy and the [employer] drug rep (makes of Novolog 70/30 Mix insulin).  Both the pharmacist and the [employer] rep told me that Novolog 70/30 Mix insulin does not have any pork derivatives in it.  Per the Novolog  70/30 Mix PI website: "NOVOLOG MIX 70/30 (insulin aspart protamine and insulin aspart injection) is a human insulin analog suspension containing 70% insulin aspart protamine crystals and 30% soluble insulin aspart. NOVOLOG MIX 70/30 is a blood-glucose-lowering agent with an earlier onset and an intermediate duration of action. Insulin aspart is homologous with regular human insulin with the exception of a single substitution of the amino acid proline by aspartic acid in position B28, and is produced by recombinant DNA technology utilizing Saccharomyces cerevisiae (baker's yeast)."     --Will follow patient during hospitalization--  Ambrose Finland RN, MSN, CDE Diabetes Coordinator Inpatient Glycemic Control Team Team Pager: [phone] (8a-5p)

## 2016-12-06 NOTE — Care Management Note (Addendum)
Case Management Note  Patient Details  Name: Daniel Porter MRN: [id number] Date of Birth: [birth year]  Subjective/Objective:                    Action/Plan:  Per ID note patient will discharge on IV Ancef end date Dec 18, 2016. Explained same to patient and his son. They were unaware. Explained HHRN will teach patient and family how to administer medication. Referral given to [facility].  Patient has appointment this Friday at [facility] for PCP.  Patient needs PICC line and signed MD orders.    Expected Discharge Date:                  Expected Discharge Plan:  Home w Home Health Services  In-House Referral:     Discharge planning Services  CM Consult, Indigent Health Clinic  Post Acute Care Choice:  Home Health Choice offered to:  Patient, Adult Children  DME Arranged:    DME Agency:     HH Arranged:  RN, IV Antibiotics HH Agency:  [facility]  Status of Service:  Completed, signed off  If discussed at Long Length of Stay Meetings, dates discussed:    Additional Comments:  Kingsley Plan, RN 12/06/2016, 10:16 AM

## 2016-12-07 DIAGNOSIS — N136 Pyonephrosis: Secondary | ICD-10-CM | POA: Diagnosis not present

## 2016-12-07 DIAGNOSIS — N3001 Acute cystitis with hematuria: Secondary | ICD-10-CM | POA: Diagnosis not present

## 2016-12-07 DIAGNOSIS — A411 Sepsis due to other specified staphylococcus: Secondary | ICD-10-CM | POA: Diagnosis not present

## 2016-12-07 LAB — GLUCOSE, CAPILLARY
GLUCOSE-CAPILLARY: 124 mg/dL — AB (ref 65–99)
Glucose-Capillary: 170 mg/dL — ABNORMAL HIGH (ref 65–99)
Glucose-Capillary: 79 mg/dL (ref 65–99)

## 2016-12-07 MED ORDER — AMLODIPINE BESYLATE 5 MG PO TABS: 5.0000 mg | ORAL_TABLET | Freq: Every day | ORAL | 0 refills | Status: DC

## 2016-12-07 MED ORDER — BLOOD GLUCOSE METER KIT: PACK | 0 refills | Status: DC

## 2016-12-07 MED ORDER — INSULIN ASPART PROT & ASPART (70-30 MIX) 100 UNIT/ML ~~LOC~~ SUSP: 12.0000 [IU] | Freq: Two times a day (BID) | SUBCUTANEOUS | 0 refills | Status: DC

## 2016-12-07 MED ORDER — INSULIN GLARGINE 100 UNIT/ML SOLOSTAR PEN: 15.0000 [IU] | PEN_INJECTOR | Freq: Every day | SUBCUTANEOUS | 0 refills | Status: DC

## 2016-12-07 MED ORDER — SODIUM CHLORIDE 0.9% FLUSH: 10.0000 mL | INTRAVENOUS | Status: DC | PRN

## 2016-12-07 MED ORDER — INSULIN SYRINGES (DISPOSABLE) U-100 0.3 ML MISC: 1.0000 | Freq: Four times a day (QID) | 0 refills | Status: DC

## 2016-12-07 MED ORDER — TAMSULOSIN HCL 0.4 MG PO CAPS: 0.4000 mg | ORAL_CAPSULE | Freq: Every day | ORAL | 0 refills | Status: DC

## 2016-12-07 MED ORDER — FINASTERIDE 5 MG PO TABS: 5.0000 mg | ORAL_TABLET | Freq: Every day | ORAL | 0 refills | Status: DC

## 2016-12-07 NOTE — Progress Notes (Signed)
Peripherally Inserted Central Catheter/Midline Placement  The IV Nurse has discussed with the patient and/or persons authorized to consent for the patient, the purpose of this procedure and the potential benefits and risks involved with this procedure.  The benefits include less needle sticks, lab draws from the catheter, and the patient may be discharged home with the catheter. Risks include, but not limited to, infection, bleeding, blood clot (thrombus formation), and puncture of an artery; nerve damage and irregular heartbeat and possibility to perform a PICC exchange if needed/ordered by physician.  Alternatives to this procedure were also discussed.  Bard Power PICC patient education guide, fact sheet on infection prevention and patient information card has been provided to patient /or left at bedside.  Interpretation via interpreter line [place] #[id number]. PICC/Midline Placement Documentation        Thorne Wirz, Lajean Manes 12/07/2016, 4:25 PM

## 2016-12-07 NOTE — Progress Notes (Signed)
At patient bedside to explain PICC, Interpreter line used [place] #[id number] providing Arabic language interpretation.  Explained PICC placement, risk and use of line for home IV antibiotics.  Patient stating that the PIV that is in his left arm was just placed yesterday.  Explained to patient that he could not go home with PIV for the length of time that therapy was needed.  Patient with multiple questions regarding his therapy and wanting answers that I am unable to provide.  Patient also states that his children are busy and wife is sick and he would not be able to give himself the medication at home.  Patient also expressed concerns regarding the cost of medications and his ability to pay for them.  When I offered to him to have the doctor discuss his therapy and options he was agreeable to this and declining PICC placement at this time.  Daphane, RN updated.  Daniel Lloyd, RN VAST

## 2016-12-07 NOTE — Discharge Instructions (Signed)
Diabetes Mellitus and Food It is important for you to manage your blood sugar (glucose) level. Your blood glucose level can be greatly affected by what you eat. Eating healthier foods in the appropriate amounts throughout the day at about the same time each day will help you control your blood glucose level. It can also help slow or prevent worsening of your diabetes mellitus. Healthy eating may even help you improve the level of your blood pressure and reach or maintain a healthy weight. General recommendations for healthful eating and cooking habits include:  Eating meals and snacks regularly. Avoid going long periods of time without eating to lose weight.  Eating a diet that consists mainly of plant-based foods, such as fruits, vegetables, nuts, legumes, and whole grains.  Using low-heat cooking methods, such as baking, instead of high-heat cooking methods, such as deep frying.  Work with your dietitian to make sure you understand how to use the Nutrition Facts information on food labels. How can food affect me? Carbohydrates Carbohydrates affect your blood glucose level more than any other type of food. Your dietitian will help you determine how many carbohydrates to eat at each meal and teach you how to count carbohydrates. Counting carbohydrates is important to keep your blood glucose at a healthy level, especially if you are using insulin or taking certain medicines for diabetes mellitus. Alcohol Alcohol can cause sudden decreases in blood glucose (hypoglycemia), especially if you use insulin or take certain medicines for diabetes mellitus. Hypoglycemia can be a life-threatening condition. Symptoms of hypoglycemia (sleepiness, dizziness, and disorientation) are similar to symptoms of having too much alcohol. If your health care provider has given you approval to drink alcohol, do so in moderation and use the following guidelines:  Women should not have more than one drink per day, and men  should not have more than two drinks per day. One drink is equal to: ? 12 oz of beer. ? 5 oz of wine. ? 1 oz of hard liquor.  Do not drink on an empty stomach.  Keep yourself hydrated. Have water, diet soda, or unsweetened iced tea.  Regular soda, juice, and other mixers might contain a lot of carbohydrates and should be counted.  What foods are not recommended? As you make food choices, it is important to remember that all foods are not the same. Some foods have fewer nutrients per serving than other foods, even though they might have the same number of calories or carbohydrates. It is difficult to get your body what it needs when you eat foods with fewer nutrients. Examples of foods that you should avoid that are high in calories and carbohydrates but low in nutrients include:  Trans fats (most processed foods list trans fats on the Nutrition Facts label).  Regular soda.  Juice.  Candy.  Sweets, such as cake, pie, doughnuts, and cookies.  Fried foods.  What foods can I eat? Eat nutrient-rich foods, which will nourish your body and keep you healthy. The food you should eat also will depend on several factors, including:  The calories you need.  The medicines you take.  Your weight.  Your blood glucose level.  Your blood pressure level.  Your cholesterol level.  You should eat a variety of foods, including:  Protein. ? Lean cuts of meat. ? Proteins low in saturated fats, such as fish, egg whites, and beans. Avoid processed meats.  Fruits and vegetables. ? Fruits and vegetables that may help control blood glucose levels, such as apples,   mangoes, and yams.  Dairy products. ? Choose fat-free or low-fat dairy products, such as milk, yogurt, and cheese.  Grains, bread, pasta, and rice. ? Choose whole grain products, such as multigrain bread, whole oats, and brown rice. These foods may help control blood pressure.  Fats. ? Foods containing healthful fats, such as  nuts, avocado, olive oil, canola oil, and fish.  Does everyone with diabetes mellitus have the same meal plan? Because every person with diabetes mellitus is different, there is not one meal plan that works for everyone. It is very important that you meet with a dietitian who will help you create a meal plan that is just right for you. This information is not intended to replace advice given to you by your health care provider. Make sure you discuss any questions you have with your health care provider. Document Released: 04/28/2005 Document Revised: 01/07/2016 Document Reviewed: 06/28/2013 Elsevier Interactive Patient Education  2017 [employer] Blood Glucose Monitoring, Adult Monitoring your blood sugar (glucose) helps you manage your diabetes. It also helps you and your health care provider determine how well your diabetes management plan is working. Blood glucose monitoring involves checking your blood glucose as often as directed, and keeping a record (log) of your results over time. Why should I monitor my blood glucose? Checking your blood glucose regularly can:  Help you understand how food, exercise, illnesses, and medicines affect your blood glucose.  Let you know what your blood glucose is at any time. You can quickly tell if you are having low blood glucose (hypoglycemia) or high blood glucose (hyperglycemia).  Help you and your health care provider adjust your medicines as needed.  When should I check my blood glucose? Follow instructions from your health care provider about how often to check your blood glucose. This may depend on:  The type of diabetes you have.  How well-controlled your diabetes is.  Medicines you are taking.  If you have type 1 diabetes:  Check your blood glucose at least 2 times a day.  Also check your blood glucose: ? Before every insulin injection. ? Before and after exercise. ? Between meals. ? 2 hours after a meal. ? Occasionally between  2:00 a.m. and 3:00 a.m., as directed. ? Before potentially dangerous tasks, like driving or using heavy machinery. ? At bedtime.  You may need to check your blood glucose more often, up to 6-10 times a day: ? If you use an insulin pump. ? If you need multiple daily injections (MDI). ? If your diabetes is not well-controlled. ? If you are ill. ? If you have a history of severe hypoglycemia. ? If you have a history of not knowing when your blood glucose is getting low (hypoglycemia unawareness). If you have type 2 diabetes:  If you take insulin or other diabetes medicines, check your blood glucose at least 2 times a day.  If you are on intensive insulin therapy, check your blood glucose at least 4 times a day. Occasionally, you may also need to check between 2:00 a.m. and 3:00 a.m., as directed.  Also check your blood glucose: ? Before and after exercise. ? Before potentially dangerous tasks, like driving or using heavy machinery.  You may need to check your blood glucose more often if: ? Your medicine is being adjusted. ? Your diabetes is not well-controlled. ? You are ill. What is a blood glucose log?  A blood glucose log is a record of your blood glucose readings. It helps you   and your health care provider: ? Look for patterns in your blood glucose over time. ? Adjust your diabetes management plan as needed.  Every time you check your blood glucose, write down your result and notes about things that may be affecting your blood glucose, such as your diet and exercise for the day.  Most glucose meters store a record of glucose readings in the meter. Some meters allow you to download your records to a computer. How do I check my blood glucose? Follow these steps to get accurate readings of your blood glucose: Supplies needed   Blood glucose meter.  Test strips for your meter. Each meter has its own strips. You must use the strips that come with your meter.  A needle to prick  your finger (lancet). Do not use lancets more than once.  A device that holds the lancet (lancing device).  A journal or log book to write down your results. Procedure  Wash your hands with soap and water.  Prick the side of your finger (not the tip) with the lancet. Use a different finger each time.  Gently rub the finger until a small drop of blood appears.  Follow instructions that come with your meter for inserting the test strip, applying blood to the strip, and using your blood glucose meter.  Write down your result and any notes. Alternative testing sites  Some meters allow you to use areas of your body other than your finger (alternative sites) to test your blood.  If you think you may have hypoglycemia, or if you have hypoglycemia unawareness, do not use alternative sites. Use your finger instead.  Alternative sites may not be as accurate as the fingers, because blood flow is slower in these areas. This means that the result you get may be delayed, and it may be different from the result that you would get from your finger.  The most common alternative sites are: ? Forearm. ? Thigh. ? Palm of the hand. Additional tips  Always keep your supplies with you.  If you have questions or need help, all blood glucose meters have a 24-hour "hotline" number that you can call. You may also contact your health care provider.  After you use a few boxes of test strips, adjust (calibrate) your blood glucose meter by following instructions that came with your meter. This information is not intended to replace advice given to you by your health care provider. Make sure you discuss any questions you have with your health care provider. Document Released: 08/04/2003 Document Revised: 02/19/2016 Document Reviewed: 01/11/2016 Elsevier Interactive Patient Education  2017 Elsevier Inc.  

## 2016-12-09 ENCOUNTER — Ambulatory Visit (INDEPENDENT_AMBULATORY_CARE_PROVIDER_SITE_OTHER): Payer: Medicare Other | Admitting: Family Medicine

## 2016-12-09 ENCOUNTER — Encounter: Payer: Self-pay | Admitting: Family Medicine

## 2016-12-09 VITALS — BP 140/80 | HR 88 | Temp 98.6°F | Resp 16 | Ht 71.0 in | Wt 193.0 lb

## 2016-12-09 DIAGNOSIS — E119 Type 2 diabetes mellitus without complications: Secondary | ICD-10-CM

## 2016-12-09 DIAGNOSIS — Z794 Long term (current) use of insulin: Secondary | ICD-10-CM | POA: Diagnosis not present

## 2016-12-09 DIAGNOSIS — Z13220 Encounter for screening for lipoid disorders: Secondary | ICD-10-CM | POA: Diagnosis not present

## 2016-12-09 DIAGNOSIS — I10 Essential (primary) hypertension: Secondary | ICD-10-CM | POA: Diagnosis not present

## 2016-12-09 DIAGNOSIS — N3289 Other specified disorders of bladder: Secondary | ICD-10-CM | POA: Diagnosis not present

## 2016-12-09 DIAGNOSIS — R339 Retention of urine, unspecified: Secondary | ICD-10-CM

## 2016-12-09 DIAGNOSIS — G510 Bell's palsy: Secondary | ICD-10-CM | POA: Diagnosis not present

## 2016-12-09 DIAGNOSIS — A419 Sepsis, unspecified organism: Secondary | ICD-10-CM

## 2016-12-09 LAB — LIPID PANEL
CHOL/HDL RATIO: 10.4 ratio — AB (ref <5.0)
CHOLESTEROL: 239 mg/dL — AB (ref <200)
HDL: 23 mg/dL — AB (ref >40)
LDL Cholesterol: 145 mg/dL — ABNORMAL HIGH (ref <100)
Triglycerides: 353 mg/dL — ABNORMAL HIGH (ref <150)
VLDL: 71 mg/dL — ABNORMAL HIGH (ref <30)

## 2016-12-09 LAB — CBC WITH DIFFERENTIAL/PLATELET
BASOS ABS: 0 {cells}/uL (ref 0–200)
Basophils Relative: 0 %
EOS ABS: 352 {cells}/uL (ref 15–500)
Eosinophils Relative: 4 %
HCT: 42.8 % (ref 38.5–50.0)
HEMOGLOBIN: 14.3 g/dL (ref 13.2–17.1)
LYMPHS ABS: 1232 {cells}/uL (ref 850–3900)
Lymphocytes Relative: 14 %
MCH: 28.1 pg (ref 27.0–33.0)
MCHC: 33.4 g/dL (ref 32.0–36.0)
MCV: 84.3 fL (ref 80.0–100.0)
MPV: 10.2 fL (ref 7.5–12.5)
Monocytes Absolute: 704 cells/uL (ref 200–950)
Monocytes Relative: 8 %
Neutro Abs: 6512 cells/uL (ref 1500–7800)
Neutrophils Relative %: 74 %
Platelets: 186 10*3/uL (ref 140–400)
RBC: 5.08 MIL/uL (ref 4.20–5.80)
RDW: 15 % (ref 11.0–15.0)
WBC: 8.8 10*3/uL (ref 3.8–10.8)

## 2016-12-09 LAB — COMPLETE METABOLIC PANEL WITH GFR
AG RATIO: 1.2 ratio (ref 1.0–2.5)
ALBUMIN: 3.8 g/dL (ref 3.6–5.1)
ALT: 14 U/L (ref 9–46)
AST: 28 U/L (ref 10–35)
Alkaline Phosphatase: 122 U/L — ABNORMAL HIGH (ref 40–115)
BUN / CREAT RATIO: 15.3 ratio (ref 6–22)
BUN: 17 mg/dL (ref 7–25)
CHLORIDE: 104 mmol/L (ref 98–110)
CO2: 21 mmol/L (ref 20–31)
Calcium: 10.5 mg/dL — ABNORMAL HIGH (ref 8.6–10.3)
Creat: 1.11 mg/dL (ref 0.70–1.18)
GFR, Est African American: 74 mL/min (ref >=60)
GFR, Est Non African American: 64 mL/min (ref >=60)
GLOBULIN: 3.2 g/dL (ref 1.9–3.7)
GLUCOSE: 152 mg/dL — AB (ref 65–99)
Potassium: 4.8 mmol/L (ref 3.5–5.3)
Sodium: 135 mmol/L (ref 135–146)
Total Bilirubin: 0.4 mg/dL (ref 0.2–1.2)
Total Protein: 7 g/dL (ref 6.1–8.1)

## 2016-12-09 LAB — POCT URINALYSIS DIP (DEVICE)
BILIRUBIN URINE: NEGATIVE
Glucose, UA: 250 mg/dL — AB
KETONES UR: NEGATIVE mg/dL
Nitrite: NEGATIVE
PH: 6 (ref 5.0–8.0)
Protein, ur: 30 mg/dL — AB
Specific Gravity, Urine: 1.02 (ref 1.005–1.030)
Urobilinogen, UA: 0.2 mg/dL (ref 0.0–1.0)

## 2016-12-09 MED ORDER — ACYCLOVIR 800 MG PO TABS: 800.0000 mg | ORAL_TABLET | Freq: Every day | ORAL | 0 refills | Status: AC

## 2016-12-09 MED ORDER — BLOOD PRESSURE CUFF MISC: 0 refills | Status: DC

## 2016-12-09 MED ORDER — INSULIN ASPART PROT & ASPART (70-30 MIX) 100 UNIT/ML ~~LOC~~ SUSP: 12.0000 [IU] | Freq: Two times a day (BID) | SUBCUTANEOUS | 0 refills | Status: DC

## 2016-12-09 NOTE — Patient Instructions (Addendum)
Administer Novolin 12 units twice daily with meals. Hold insulin dose if blood sugar is less than 125.  Start checking your blood pressure every morning. If blood pressure is persistently greater than 150/90, please contact me for instruction.  I have prescribed Acyclovir 800 mg 5 times daily for 7 days to complete treatment for Bell's Palsy.  If facial dropping and or neck pain persists, return for care sooner than month follow-up.  I am referring you to opthalmology for a complete eye examination.     Type 2 Diabetes Mellitus, Diagnosis, Adult Type 2 diabetes (type 2 diabetes mellitus) is a long-term (chronic) disease. It may be caused by one or both of these problems:  Your body does not make enough of a hormone called insulin.  Your body does not react in a normal way to insulin that it makes. Insulin lets sugars (glucose) go into cells in the body. This gives you energy. If you have type 2 diabetes, sugars cannot get into cells. This causes high blood sugar (hyperglycemia). Your doctor will set treatment goals for you. Generally, you should have these blood sugar levels:  Before meals (preprandial): 80-130 mg/dL (4.0-9.8 mmol/L).  After meals (postprandial): below 180 mg/dL (10 mmol/L).  A1c (hemoglobin A1c) level: less than 7%. Follow these instructions at home: Questions to Ask Your Doctor   You may want to ask these questions:  Do I need to meet with a diabetes educator?  Where can I find a support group for people with diabetes?  What equipment will I need to care for myself at home?  What diabetes medicines do I need? When should I take them?  How often do I need to check my blood sugar?  What number can I call if I have questions?  When is my next doctor's visit? General instructions   Take over-the-counter and prescription medicines only as told by your doctor.  Keep all follow-up visits as told by your doctor. This is important. Contact a doctor  if:  Your blood sugar is at or above 240 mg/dL (11.9 mmol/L) for 2 days in a row.  You have been sick or have had a fever for 2 days or more and you are not getting better.  You have any of these problems for more than 6 hours:  You cannot eat or drink.  You feel sick to your stomach (nauseous).  You throw up (vomit).  You have watery poop (diarrhea). Get help right away if:  Your blood sugar is lower than 54 mg/dL (3 mmol/L).  You get confused.  You have trouble:  Thinking clearly.  Breathing.  You have moderate or large ketone levels in your pee (urine). This information is not intended to replace advice given to you by your health care provider. Make sure you discuss any questions you have with your health care provider. Document Released: 05/10/2008 Document Revised: 01/07/2016 Document Reviewed: 09/04/2015 Elsevier Interactive Patient Education  2017 [employer].

## 2016-12-09 NOTE — Progress Notes (Signed)
Patient ID: Daniel Porter, male    DOB: 09/20/1940, 76 y.o.   MRN: [id number]  PCP: Daniel Barrows, FNP  Chief Complaint  Patient presents with  . Establish Care    Subjective:  HPI Daniel Porter is a 76 y.o. male presents to establish care.  Medical history includes: Type 2 Diabetes Mellitus, CKD2, Bladder Mass, abdominal and inguinal hernias, recurrent UTI's due to urinary retention. Px. Surgery 2014 Hernia Repair   Daniel Porter is here today accompanied by his grandson. He was recently discharged from [facility] after a 6 day admission (12/01/2016-12/07/2016) for sepsis and UTI w/hematuria, During his admission he was found to have an 8.3 X10.9 cm cystic mass lesion right side of bladder.  He was discharge home with home health services to administer long term IV antibiotics. He has a PICC line in his upper left forearm.   Daniel Porter is a poor historian regarding prior medical care. He has great deal of mistrust of healthcare providers as he feels his wife's healthcare provider was responsible for her death. For this reason, he hasn't received  routine medical care.   Daniel Porter, hemoglobin A1C during recent hospitalization 10.2. Reports that his blood sugar over the last two days has been 154 and 164. He has not  administered his Novolin as prescribed, reports that he's tried it once or twice.  Previously he was told to take medication for diabetes, hyperlipidemia, and hypertension,  but stopped taking it over 4 years ago as he felt he could take care of his health himself.  He was recently seen at a local urgent care and diagnosed with Bell's Palsy. He completed the prednisone prescription but did not obtain the valacyclovir due to the cost of the medication. He requests a less expensive antiviral today.    Social History   Social History  . Marital status: Married    Spouse name: N/A  . Number of children: N/A  . Years of education: N/A   Occupational History  .  Not on file.   Social History Main Topics  . Smoking status: Never Smoker  . Smokeless tobacco: Never Used  . Alcohol use No  . Drug use: No  . Sexual activity: Not on file   Other Topics Concern  . Not on file   Social History Narrative  . No narrative on file   History reviewed. No pertinent family history. Review of Systems SEE HPI  Patient Active Problem List   Diagnosis Date Noted  . DM2 (diabetes mellitus, type 2) ([place]) 12/02/2016  . Sepsis secondary to UTI ([place]) 12/02/2016  . Pyohydronephrosis 12/02/2016  . Bladder mass 12/02/2016  . CKD (chronic kidney disease) stage 3, GFR 30-59 ml/min 12/02/2016  . Recurrent umbilical hernia 36/14/4315  . S/P umbilical hernia repair, follow-up exam 11/30/2011  . Umbilical hernia 40/03/6760  . Right inguinal hernia 10/12/2011    Allergies  Allergen Reactions  . Pork-Derived Products Other (See Comments)    Pt doesn't consume any pork products    Prior to Admission medications   Medication Sig Start Date End Date Taking? Authorizing Provider  ceFAZolin (ANCEF) IVPB Inject 2 g into the vein every 8 (eight) hours. Indication:  bacteremia Last Day of Therapy:  12/18/2016 Labs - Once weekly:  CBC/D and BMP, Labs - Every other week:  ESR and CRP 12/06/16  Yes Dawood S Elgergawy, MD  amLODipine (NORVASC) 5 MG tablet Take 1 tablet (5 mg total) by mouth daily. 12/08/16  Lavina Hamman, MD  artificial tears (LACRILUBE) OINT ophthalmic ointment Place into the right eye every 3 (three) hours as needed for dry eyes. 11/21/16   Janne Napoleon, NP  blood glucose meter kit and supplies Dispense based on patient and insurance preference. Use up to four times daily as directed. (FOR ICD-9 250.00, 250.01). 12/07/16   Lavina Hamman, MD  finasteride (PROSCAR) 5 MG tablet Take 1 tablet (5 mg total) by mouth daily. 12/08/16   Lavina Hamman, MD  ibuprofen (ADVIL,MOTRIN) 200 MG tablet Take 400 mg by mouth every 6 (six) hours as needed for headache or moderate  pain.    Historical Provider, MD  insulin aspart protamine- aspart (NOVOLOG MIX 70/30) (70-30) 100 UNIT/ML injection Inject 0.12 mLs (12 Units total) into the skin 2 (two) times daily with a meal. 12/07/16   Lavina Hamman, MD  Insulin Syringes, Disposable, U-100 0.3 ML MISC 1 Act by Does not apply route 4 (four) times daily. 12/07/16   Lavina Hamman, MD  OVER THE COUNTER MEDICATION Take 2 tablets by mouth every 6 (six) hours as needed (for pain). OTC Back and Body Aspirin    Historical Provider, MD  tamsulosin (FLOMAX) 0.4 MG CAPS capsule Take 1 capsule (0.4 mg total) by mouth daily after supper. 12/07/16   Lavina Hamman, MD    Past Medical, Surgical Family and Social History reviewed and updated.    Objective:   Today's Vitals   12/09/16 1116  BP: (!) 145/90  Pulse: 88  Resp: 16  Temp: 98.6 F (37 C)  TempSrc: Oral  SpO2: 97%  Weight: 193 lb (87.5 kg)  Height: 5' 11"  (1.803 m)    Wt Readings from Last 3 Encounters:  12/09/16 193 lb (87.5 kg)  12/02/16 196 lb 13.9 oz (89.3 kg)  10/17/12 210 lb (95.3 kg)    Physical Exam  Constitutional: He is oriented to person, place, and time. Vital signs are normal. He appears well-developed and well-nourished. He is active.  HENT:  Head: Normocephalic and atraumatic.  Eyes: Conjunctivae and EOM are normal. Pupils are equal, round, and reactive to light.  Neck: Normal range of motion. Neck supple. No thyromegaly present.  Cardiovascular: Normal rate, regular rhythm, normal heart sounds and intact distal pulses.   Pulmonary/Chest: Effort normal and breath sounds normal.  Abdominal: Bowel sounds are normal. He exhibits no distension, no fluid wave, no ascites and no mass. There is no tenderness. There is no CVA tenderness. A hernia is present. Hernia confirmed positive in the left inguinal area.    Genitourinary:     Musculoskeletal: Normal range of motion.  Lymphadenopathy:    He has no cervical adenopathy.  Neurological: He is  alert and oriented to person, place, and time.  Skin: Skin is warm and dry.  Psychiatric: He has a normal mood and affect. His behavior is normal. Judgment and thought content normal.       Assessment & Plan:  1. Urinary retention -Continue Tamulosin and Finasteride.  2. Bladder mass -Follow-up with urology scheduled.  3. Screening, lipid - Thyroid Panel With TSH - Lipid panel  4. Sepsis, due to unspecified organism ([place]) - CBC with Differential  5. Type 2 diabetes mellitus without complication, with long-term current use of insulin ([facility]) - COMPLETE METABOLIC PANEL WITH GFR - Microalbumin, urine - Ambulatory referral to Ophthalmology -Novolin 12 units twice daily with meals   6. Hypertension  -continue amlodipine  7. Bell's Palsy  -Acyclovir 800 mg,  5 times daily for 7 days  Will defer any refers to General Surgery for umbilical hernia as patient insists that the hernia is not painful and he has no desire to  undergo surgery.  RTC: 3 months     Carroll Sage. Kenton Kingfisher, MSN, [facility] [address] [place], [place] [address] [phone]

## 2016-12-10 ENCOUNTER — Ambulatory Visit (HOSPITAL_COMMUNITY)
Admission: EM | Admit: 2016-12-10 | Discharge: 2016-12-10 | Disposition: A | Discharge status: Home / Self Care | Payer: Medicaid Other | Attending: Internal Medicine | Admitting: Internal Medicine

## 2016-12-10 ENCOUNTER — Encounter (HOSPITAL_COMMUNITY): Payer: Self-pay | Admitting: Emergency Medicine

## 2016-12-10 DIAGNOSIS — G510 Bell's palsy: Secondary | ICD-10-CM

## 2016-12-10 DIAGNOSIS — R599 Enlarged lymph nodes, unspecified: Secondary | ICD-10-CM | POA: Diagnosis not present

## 2016-12-10 DIAGNOSIS — Z7189 Other specified counseling: Secondary | ICD-10-CM | POA: Diagnosis not present

## 2016-12-10 DIAGNOSIS — R591 Generalized enlarged lymph nodes: Secondary | ICD-10-CM

## 2016-12-10 LAB — CULTURE, BLOOD (ROUTINE X 2)
CULTURE: NO GROWTH
CULTURE: NO GROWTH
SPECIAL REQUESTS: ADEQUATE
SPECIAL REQUESTS: ADEQUATE

## 2016-12-10 LAB — THYROID PANEL WITH TSH
FREE THYROXINE INDEX: 2.4 (ref 1.4–3.8)
T3 Uptake: 25 % (ref 22–35)
T4, Total: 9.4 ug/dL (ref 4.5–12.0)
TSH: 9.63 m[IU]/L — AB (ref 0.40–4.50)

## 2016-12-10 LAB — MICROALBUMIN, URINE: Microalb, Ur: 9.2 mg/dL

## 2016-12-10 NOTE — ED Provider Notes (Signed)
CSN: [id number]     Arrival date & time 12/10/16  1335 History   First MD Initiated Contact with Patient 12/10/16 1433     Chief Complaint  Patient presents with  . Neck Pain   (Consider location/radiation/quality/duration/timing/severity/associated sxs/prior Treatment) 76 year old male presents to clinic with 2 chief complaints, the first being a small "bump" backwards neck. Been present for several days, states his painful, and interfering with his sleep. He has no fever, there is no redness, and the area is not painful to touch.  Second complaint is concerning medications. He was seen in clinic on 11/21/2016, diagnosed with Bell's palsy, and started on Valtrex. States he cannot afford this medication, he needs something different.   The history is provided by the patient.    Past Medical History:  Diagnosis Date  . Diabetes mellitus without complication ([place])   . GERD (gastroesophageal reflux disease)   . Hyperlipidemia   . Hypertension   . Initial insomnia   . Umbilical hernia    Past Surgical History:  Procedure Laterality Date  . HERNIA REPAIR     2  . UMBILICAL HERNIA REPAIR  11/22/2011   Procedure: HERNIA REPAIR UMBILICAL ADULT;  Surgeon: Daniel Jarred, MD;  Location: [facility];  Service: General;  Laterality: N/A;   History reviewed. No pertinent family history. Social History  Substance Use Topics  . Smoking status: Never Smoker  . Smokeless tobacco: Never Used  . Alcohol use No    Review of Systems  Constitutional: Negative.   HENT: Negative.   Respiratory: Negative.   Cardiovascular: Negative.   Gastrointestinal: Negative.   Genitourinary: Negative.   Musculoskeletal: Negative for arthralgias, neck pain and neck stiffness.  Skin: Negative.   Neurological: Negative.   All other systems reviewed and are negative.   Allergies  Pork-derived products  Home Medications   Prior to Admission medications   Medication Sig Start Date End Date Taking?  Authorizing Provider  amLODipine (NORVASC) 5 MG tablet Take 1 tablet (5 mg total) by mouth daily. 12/08/16  Yes Daniel Hamman, MD  artificial tears (LACRILUBE) OINT ophthalmic ointment Place into the right eye every 3 (three) hours as needed for dry eyes. 11/21/16  Yes Daniel Napoleon, NP  blood glucose meter kit and supplies Dispense based on patient and insurance preference. Use up to four times daily as directed. (FOR ICD-9 250.00, 250.01). 12/07/16  Yes Daniel Hamman, MD  Blood Pressure Monitoring (BLOOD PRESSURE CUFF) MISC Check blood pressure once daily. If readings greater than 150/90 please phone office 12/09/16  Yes Daniel Jun, FNP  finasteride (PROSCAR) 5 MG tablet Take 1 tablet (5 mg total) by mouth daily. 12/08/16  Yes Daniel Hamman, MD  insulin aspart protamine- aspart (NOVOLOG MIX 70/30) (70-30) 100 UNIT/ML injection Inject 0.12 mLs (12 Units total) into the skin 2 (two) times daily with a meal. 12/09/16  Yes Daniel Jun, FNP  Insulin Syringes, Disposable, U-100 0.3 ML MISC 1 Act by Does not apply route 4 (four) times daily. 12/07/16  Yes Daniel Hamman, MD  acyclovir (ZOVIRAX) 800 MG tablet Take 1 tablet (800 mg total) by mouth 5 (five) times daily. 12/09/16 12/16/16  Daniel Jun, FNP  ceFAZolin (ANCEF) IVPB Inject 2 g into the vein every 8 (eight) hours. Indication:  bacteremia Last Day of Therapy:  12/18/2016 Labs - Once weekly:  CBC/D and BMP, Labs - Every other week:  ESR and CRP 12/06/16   Daniel Patricia, MD  ibuprofen (ADVIL,MOTRIN) 200 MG tablet Take 400 mg by mouth every 6 (six) hours as needed for headache or moderate pain.    Historical Provider, MD  OVER THE COUNTER MEDICATION Take 2 tablets by mouth every 6 (six) hours as needed (for pain). OTC Back and Body Aspirin    Historical Provider, MD  tamsulosin (FLOMAX) 0.4 MG CAPS capsule Take 1 capsule (0.4 mg total) by mouth daily after supper. 12/07/16   Daniel Hamman, MD   Meds Ordered and Administered this Visit   Medications - No data to display  BP (!) 154/91 (BP Location: Right Arm)   Pulse 85   Temp 98.3 F (36.8 C) (Oral)   Resp 16   SpO2 98%  No data found.   Physical Exam  Constitutional: He is oriented to person, place, and time. He appears well-developed and well-nourished. No distress.  HENT:  Head: Normocephalic and atraumatic.  Right Ear: External ear normal.  Left Ear: External ear normal.  Neck: Normal range of motion. Neck supple.  Cardiovascular: Normal rate and regular rhythm.   Pulmonary/Chest: Effort normal and breath sounds normal.  Lymphadenopathy:       Head (right side): Occipital adenopathy present.  Neurological: He is alert and oriented to person, place, and time.  Skin: Skin is warm and dry. He is not diaphoretic.  PICC line inserted at the right before meals. No erythema, swelling, or other signs of infection present  Nursing note and vitals reviewed.   Urgent Care Course     Procedures (including critical care time)  Labs Review Labs Reviewed - No data to display  Imaging Review No results found.    MDM   1. Counseling regarding goals of care   2. Lymphadenopathy     Patient's medical records were reviewed, primary care provider prescribed him acyclovir yesterday for follow-up of his Bell's palsy. This was explained to the patient, he was informed that he did not need Valtrex anymore that he was prescribed on April 9, and to take the acyclovir as prescribed by his provider instead  With regard to the lymphadenopathy, finish therapy as prescribed by his PCP, should resolve within 2 weeks, follow up with PCP if it remains.     Daniel Glasgow, NP 12/10/16 1722

## 2016-12-10 NOTE — Discharge Summary (Signed)
[facility] Discharge Summary   Patient: Daniel Porter QJF:[id number]   PCP: Molli Barrows, FNP DOB: March 27, 1941   Date of admission: 12/01/2016   Date of discharge: 12/07/2016    Discharge Diagnoses:  Principal Problem:   Pyohydronephrosis Active Problems:   DM2 (diabetes mellitus, type 2) ([place])   Sepsis secondary to UTI ([place])   Bladder mass   CKD (chronic kidney disease) stage 3, GFR 30-59 ml/min   Admitted From: home Disposition:  Home with home health  Recommendations for Outpatient Follow-up:  1. Follow-up with urology in 2 weeks, follow-up with PCP in one week.   Follow-up Information    ESKRIDGE, MATTHEW, MD Follow up in 2 week(s).   Specialty:  Urology Contact information: [place] [id number] [phone]        [place]. Go to.   Why:  Appointment Friday December 09, 2016 at 10 am Contact information: [address] [place] [address]       [facility] Follow up.   Why:  Home Health RN  Contact information: [address] [place] [address] [phone]          Diet recommendation: Cardiac diet  Activity: The patient is advised to gradually reintroduce usual activities.  Discharge Condition: good  Code Status: Full code  History of present illness: As per the H and P dictated on admission, "Daniel Porter is a 76 y.o. male with medical history significant of recurrent UTIs over the past 2 years.  He presents to the ED after his family called EMS due to rigors and mental status changes.  Symptoms onset rapidly earlier in the evening, worsening, nothing makes better or worse.   ED Course: In the ED he was septic with tachycardia, fever 102.1, initial lactate 2.05 but corrects with IVR bolus.  Creat 1.4.  Patient started on zosyn and vanc.  Work up reveals source of sepsis to be UTI with associated hydronephrosis due to compression of ureteur by what is  probably a bladder diverticulum according to Dr. Junious Silk.  Mental status now much improved per family."  Hospital Course:  Summary of his active problems in the hospital is as following. 1. Sepsis d/t coagulase negative Staphylococcus complicated UTI and bacteremia: Patient met sepsis criteria on admission, treated with sepsis protocol with IV fluids, broad-spectrum IV antibiotics including vancomycin and Zosyn (Prior enterococcal & E Coli UTI). Sepsis physiology resolved. ID consult greatly appreciated, recommendation is for IV Ancef total of 14 days, so PICC line requested, case management consulted to arrange for home care for prolonged IV antibiotic therapy at home . HIV and hepatitis C antibody: Nonreactive 2. Right Hydronephrosis/hydroureter: likely due to bladder and right bladder diverticulum distention. Urology consulted> Foley catheter placed and 1100 ml urine drained initially. 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 Foley catheter discontinued 4/22 and patient voiding well. As per Dr. Eskridge/urology, he will arrange outpatient follow-up for cystoscopy/renal ultrasound as outpatient. 3. 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 4. Acute encephalopathy: Likely related to sepsis. No new focal deficits. Resolved, mentation back to baseline 5. Thrombocytopenia:?  Most likely related to sepsis, improving, Hyponatremia: Due to dehydration and hyperglycemia. Resolved 6. Stage  3 CKD: Creatinine has normalized. 7. Uncontrolled/untreated DM2: stated home CBG's ranged from 180-400 and drinks sweat drinks nightly. Hemoglobin A1c is 10.2, uncontrolled, he  is on Lantus during hospital stay, plan to discharge tomorrow on insulin 70/30, 12 units twice a day, he was educated by nursing staff, and diabetic coordinator(please see prescription numbers given by diabetic coordinator to be given to patients before discharge tomorrow) 8. Essential hypertension: Mildly uncontrolled. Started amlodipine 5 MG daily. Reluctant to start ACEI/ARB due to recent acute kidney injury and question about compliance to M.D. follow-ups and lab draws. Better. 9. Recent right-sided Bell's palsy: Apparently occurred 2 weeks ago. Didn't fill one of the meds ? Antiviral   All other chronic medical condition were stable during the hospitalization.  Patient was ambulatory without any assistance. On the day of the discharge the patient's vitals were stable, and no other acute medical condition were reported by patient. the patient was felt safe to be discharge at home with home health.  Procedures and Results:  Foley catheter, remvoed   Consultations:  Urology  Infectious disease  DISCHARGE MEDICATION: Discharge Medication List as of 12/07/2016  7:48 PM    START taking these medications   Details  ceFAZolin (ANCEF) IVPB Inject 2 g into the vein every 8 (eight) hours. Indication:  bacteremia Last Day of Therapy:  12/18/2016 Labs - Once weekly:  CBC/D and BMP, Labs - Every other week:  ESR and CRP, Starting Tue 12/06/2016, Print    amLODipine (NORVASC) 5 MG tablet Take 1 tablet (5 mg total) by mouth daily., Starting Thu 12/08/2016, Normal    blood glucose meter kit and supplies Dispense based on patient and insurance preference. Use up to four times daily as directed. (FOR ICD-9 250.00, 250.01)., Print    finasteride (PROSCAR) 5 MG tablet Take 1 tablet (5 mg total) by mouth daily., Starting Thu 12/08/2016, Normal    Insulin Syringes, Disposable, U-100 0.3 ML MISC 1 Act by Does not apply route 4 (four) times daily., Starting Wed 12/07/2016, Normal    tamsulosin (FLOMAX) 0.4  MG CAPS capsule Take 1 capsule (0.4 mg total) by mouth daily after supper., Starting Wed 12/07/2016, Normal    insulin aspart protamine- aspart (NOVOLOG MIX 70/30) (70-30) 100 UNIT/ML injection Inject 0.12 mLs (12 Units total) into the skin 2 (two) times daily with a meal., Starting Wed 12/07/2016, Normal      CONTINUE these medications which have NOT CHANGED   Details  artificial tears (LACRILUBE) OINT ophthalmic ointment Place into the right eye every 3 (three) hours as needed for dry eyes., Starting Mon 11/21/2016, Print    ibuprofen (ADVIL,MOTRIN) 200 MG tablet Take 400 mg by mouth every 6 (six) hours as needed for headache or moderate pain., Historical Med    OVER THE COUNTER MEDICATION Take 2 tablets by mouth every 6 (six) hours as needed (for pain). OTC Back and Body Aspirin, Historical Med      STOP taking these medications     predniSONE (DELTASONE) 20 MG tablet        Allergies  Allergen Reactions  . Pork-Derived Products Other (See Comments)    Pt doesn't consume any pork products   Discharge Instructions    Diet Carb Modified    Complete by:  As directed    Discharge instructions    Complete by:  As directed    It is important that you read following instructions as well as go over your medication list with RN to help you  understand your care after this hospitalization.  Discharge Instructions: Please follow-up with PCP in one week  Please request your primary care physician to go over all Hospital Tests and Procedure/Radiological results at the follow up,  Please get all Hospital records sent to your PCP by signing hospital release before you go home.   You were cared for by a hospitalist during your hospital stay. If you have any questions about your discharge medications or the care you received while you were in the hospital after you are discharged, you can call the unit and ask to speak with the hospitalist on call if the hospitalist that took care of you is not  available.  Once you are discharged, your primary care physician will handle any further medical issues. Please note that NO REFILLS for any discharge medications will be authorized once you are discharged, as it is imperative that you return to your primary care physician (or establish a relationship with a primary care physician if you do not have one) for your aftercare needs so that they can reassess your need for medications and monitor your lab values. You Must read complete instructions/literature along with all the possible adverse reactions/side effects for all the Medicines you take and that have been prescribed to you. Take any new Medicines after you have completely understood and accept all the possible adverse reactions/side effects. Wear Seat belts while driving. If you have smoked or chewed Tobacco in the last 2 yrs please stop smoking and/or stop any Recreational drug use.   Home infusion instructions [facility] May follow [place] Dosing Protocol; May administer Cathflo as needed to maintain patency of vascular access device.; Flushing of vascular access device: per [facility] Protocol: 0.9% NaCl pre/post medica...    Complete by:  As directed    Instructions:  May follow [place] Dosing Protocol   Instructions:  May administer Cathflo as needed to maintain patency of vascular access device.   Instructions:  Flushing of vascular access device: per [facility] Protocol: 0.9% NaCl pre/post medication administration and prn patency; Heparin 100 u/ml, 28m for implanted ports and Heparin 10u/ml, 531mfor all other central venous catheters.   Instructions:  May follow [facility] Anaphylaxis Protocol for First Dose Administration in the home: 0.9% NaCl at 25-50 ml/hr to maintain IV access for protocol meds. Epinephrine 0.3 ml IV/IM PRN and Benadryl 25-50 IV/IM PRN s/s of anaphylaxis.   Instructions:  [facility] Coordinator (RN) to assist per patient IV care needs in the home PRN.    Increase activity slowly    Complete by:  As directed      Discharge Exam: Filed Weights   12/02/16 0635  Weight: 89.3 kg (196 lb 13.9 oz)   Vitals:   12/06/16 2035 12/07/16 0447  BP: 138/89 130/72  Pulse: 86 82  Resp: 17 17  Temp: 97.7 F (36.5 C) 98.1 F (36.7 C)   General: Appear in no distress, n Rash; Oral Mucosa moist. Cardiovascular: S1 and S2 Present, no Murmur, no JVD Respiratory: Bilateral Air entry present and Clear to Auscultation, no Crackles, no wheezes Abdomen: Bowel Sound present, Soft and no tenderness Extremities: no Pedal edema, no calf tenderness Neurology: Grossly no focal neuro deficit.  The results of significant diagnostics from this hospitalization (including imaging, microbiology, ancillary and laboratory) are listed below for reference.    Significant Diagnostic Studies: [place]  Result Date: 12/02/2016 CLINICAL DATA:  Right hydronephrosis, urinary bladder diverticulum EXAM: RENAL / URINARY TRACT  ULTRASOUND COMPLETE COMPARISON:  CT scan same day FINDINGS: Right Kidney: Length: 12.3 cm. Normal echogenicity. Mild right hydronephrosis. A cyst in upper pole measures 8 x 7 mm. Left Kidney: Length: 11.5 cm. Echogenicity within normal limits. No hydronephrosis. There is a cyst in midpole posterior aspect measures 2.3 x 2.1 cm. Bladder: There is a Foley catheter within partially decompressed urinary bladder. Persistent hypoechoic cystic lesion in right pelvis just lateral to the bladder which may represent bladder diverticulum or ureterocele. IMPRESSION: 1. Mild right hydronephrosis. Bilateral renal cysts. No left hydronephrosis. 2. There is a Foley catheter within partially decompressed urinary bladder. Persistent hypoechoic cystic lesion in right pelvis just lateral to the bladder which may represent bladder diverticulum or ureterocele. Electronically Signed   By: Lahoma Crocker M.D.   On: 12/02/2016 14:58   Dg Chest Port 1 View  Result Date: 12/01/2016 CLINICAL  DATA:  Altered mental status, onset today.  Fever. EXAM: PORTABLE CHEST 1 VIEW COMPARISON:  11/16/2011 FINDINGS: Shallow inspiration. Linear atelectasis in the lung bases. Heart size and pulmonary vascularity are normal for technique. No focal consolidation. No blunting of costophrenic angles. No pneumothorax. Mediastinal contours appear intact. Degenerative changes in the spine. IMPRESSION: Shallow inspiration with linear atelectasis in the lung bases. Electronically Signed   By: Lucienne Capers M.D.   On: 12/01/2016 23:41   Ct Renal Stone Study  Result Date: 12/02/2016 CLINICAL DATA:  Hematuria and sepsis.  Combative. EXAM: CT ABDOMEN AND PELVIS WITHOUT CONTRAST TECHNIQUE: Multidetector CT imaging of the abdomen and pelvis was performed following the standard protocol without IV contrast. COMPARISON:  None. FINDINGS: Lower chest: Atelectasis in the lung bases. Motion artifact limits evaluation. Coronary artery calcifications. Small esophageal hiatal hernia. Hepatobiliary: No focal liver abnormality is seen. No gallstones, gallbladder wall thickening, or biliary dilatation. Pancreas: Unremarkable. No pancreatic ductal dilatation or surrounding inflammatory changes. Spleen: Spleen is mildly enlarged.  No focal lesions. Adrenals/Urinary Tract: No adrenal gland nodules. There is right hydronephrosis and hydroureter down to the level of the bladder. No obstructing stones are visualized. In the right pelvis adjacent to the bladder and displacing the bladder towards the left, there is a large cystic mass lesion. The lesion measures 8.3 x 10.9 cm. The mass displaces the right ureter towards the left. The ureter extends between the mass in the bladder. 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 This could possibly represent a large ureterocele. More likely, this represents a mesenteric cyst or other cystic mass. The  bladder wall is not thickened and no bladder filling defects are demonstrated. No hydronephrosis of the left kidney. Parenchymal atrophy involving both kidneys. Small exophytic mass lesions arising from both kidneys are incompletely characterized on noncontrast imaging but likely represent cysts. The bladder is mildly distended suggesting possible bladder outlet obstruction. Stomach/Bowel: There is a moderate size right inguinal hernia containing a portion of the sigmoid colon. No proximal obstruction. There is adjacent fat herniation in the right inguinal region possibly representing a femoral hernia. There is a ventral anterior abdominal wall hernia to the left of midline containing small bowel. Herniated bowel are decompressed. No evidence of proximal obstruction. The appendix is normal. Stomach, small bowel, and colon are mostly decompressed without abnormal distention. Vascular/Lymphatic: Aortic atherosclerosis. No enlarged abdominal or pelvic lymph nodes. Reproductive: Prostate gland is enlarged, measuring 6.5 cm diameter. Other: No free air or free fluid in the abdomen. Musculoskeletal: Degenerative  changes in the spine. No destructive bone lesions. IMPRESSION: 1. Large cystic mass in the right pelvis abutting the bladder. This may be causing extrinsic compression of the right ureter. There is right-sided hydronephrosis and hydroureter without a stone identified. Etiology of the mass is uncertain. Could represent a bladder diverticulum all of the bladder wall appears to be continuous. The could represent a large ureterocele. It could represent a mesenteric mass or other cystic mass. Consider further evaluation with MRI. 2. Right inguinal hernia containing sigmoid colon without proximal obstruction. Left anterior ventral abdominal wall hernia containing small bowel without proximal obstruction. 3. Prominent enlargement of the prostate gland with moderate bladder distention possibly indicating outlet  obstruction. 4. Small bilateral renal cysts. Bilateral renal parenchymal atrophy. Small esophageal hiatal hernia. Electronically Signed   By: Lucienne Capers M.D.   On: 12/02/2016 01:10    Microbiology: Recent Results (from the past 240 hour(s))  Blood Culture (routine x 2)     Status: Abnormal   Collection Time: 12/01/16 11:14 PM  Result Value Ref Range Status   Specimen Description BLOOD LEFT WRIST  Final   Special Requests   Final    BOTTLES DRAWN AEROBIC AND ANAEROBIC Blood Culture adequate volume   Culture  Setup Time   Final    GRAM POSITIVE COCCI IN CLUSTERS IN BOTH AEROBIC AND ANAEROBIC BOTTLES CRITICAL RESULT CALLED TO, READ BACK BY AND VERIFIED WITH: J MILLEN PHARMD 2024 12/02/16 A BROWNING    Culture STAPHYLOCOCCUS SPECIES (COAGULASE NEGATIVE) (A)  Final   Report Status 12/05/2016 FINAL  Final   Organism ID, Bacteria STAPHYLOCOCCUS SPECIES (COAGULASE NEGATIVE)  Final      Susceptibility   Staphylococcus species (coagulase negative) - MIC*    CIPROFLOXACIN <=0.5 SENSITIVE Sensitive     ERYTHROMYCIN >=8 RESISTANT Resistant     GENTAMICIN <=0.5 SENSITIVE Sensitive     OXACILLIN <=0.25 SENSITIVE Sensitive     TETRACYCLINE <=1 SENSITIVE Sensitive     VANCOMYCIN 1 SENSITIVE Sensitive     TRIMETH/SULFA <=10 SENSITIVE Sensitive     CLINDAMYCIN <=0.25 SENSITIVE Sensitive     RIFAMPIN <=0.5 SENSITIVE Sensitive     Inducible Clindamycin NEGATIVE Sensitive     * STAPHYLOCOCCUS SPECIES (COAGULASE NEGATIVE)  Blood Culture ID Panel (Reflexed)     Status: Abnormal   Collection Time: 12/01/16 11:14 PM  Result Value Ref Range Status   Enterococcus species NOT DETECTED NOT DETECTED Final   Listeria monocytogenes NOT DETECTED NOT DETECTED Final   Staphylococcus species DETECTED (A) NOT DETECTED Final    Comment: Methicillin (oxacillin) susceptible coagulase negative staphylococcus. Possible blood culture contaminant (unless isolated from more than one blood culture draw or clinical case  suggests pathogenicity). No antibiotic treatment is indicated for blood  culture contaminants. CRITICAL RESULT CALLED TO, READ BACK BY AND VERIFIED WITH: Dion Body PHARMD 2024 12/02/16 A BROWNING    Staphylococcus aureus NOT DETECTED NOT DETECTED Final   Methicillin resistance NOT DETECTED NOT DETECTED Final   Streptococcus species NOT DETECTED NOT DETECTED Final   Streptococcus agalactiae NOT DETECTED NOT DETECTED Final   Streptococcus pneumoniae NOT DETECTED NOT DETECTED Final   Streptococcus pyogenes NOT DETECTED NOT DETECTED Final   Acinetobacter baumannii NOT DETECTED NOT DETECTED Final   Enterobacteriaceae species NOT DETECTED NOT DETECTED Final   Enterobacter cloacae complex NOT DETECTED NOT DETECTED Final   Escherichia coli NOT DETECTED NOT DETECTED Final   Klebsiella oxytoca NOT DETECTED NOT DETECTED Final   Klebsiella pneumoniae NOT DETECTED NOT DETECTED Final  Proteus species NOT DETECTED NOT DETECTED Final   Serratia marcescens NOT DETECTED NOT DETECTED Final   Haemophilus influenzae NOT DETECTED NOT DETECTED Final   Neisseria meningitidis NOT DETECTED NOT DETECTED Final   Pseudomonas aeruginosa NOT DETECTED NOT DETECTED Final   Candida albicans NOT DETECTED NOT DETECTED Final   Candida glabrata NOT DETECTED NOT DETECTED Final   Candida krusei NOT DETECTED NOT DETECTED Final   Candida parapsilosis NOT DETECTED NOT DETECTED Final   Candida tropicalis NOT DETECTED NOT DETECTED Final  Blood Culture (routine x 2)     Status: Abnormal   Collection Time: 12/01/16 11:15 PM  Result Value Ref Range Status   Specimen Description BLOOD RIGHT ARM  Final   Special Requests   Final    BOTTLES DRAWN AEROBIC AND ANAEROBIC Blood Culture adequate volume   Culture  Setup Time   Final    GRAM POSITIVE COCCI IN CLUSTERS IN BOTH AEROBIC AND ANAEROBIC BOTTLES CRITICAL RESULT CALLED TO, READ BACK BY AND VERIFIED WITH: J MILLEN PHARMD 2024 12/02/16 A BROWNING    Culture (A)  Final     STAPHYLOCOCCUS SPECIES (COAGULASE NEGATIVE) SUSCEPTIBILITIES PERFORMED ON PREVIOUS CULTURE WITHIN THE LAST 5 DAYS.    Report Status 12/05/2016 FINAL  Final  Urine culture     Status: Abnormal   Collection Time: 12/01/16 11:38 PM  Result Value Ref Range Status   Specimen Description URINE, RANDOM  Final   Special Requests NONE  Final   Culture (A)  Final    >=100,000 COLONIES/mL STAPHYLOCOCCUS SPECIES (COAGULASE NEGATIVE)   Report Status 12/04/2016 FINAL  Final   Organism ID, Bacteria STAPHYLOCOCCUS SPECIES (COAGULASE NEGATIVE) (A)  Final      Susceptibility   Staphylococcus species (coagulase negative) - MIC*    CIPROFLOXACIN <=0.5 SENSITIVE Sensitive     GENTAMICIN <=0.5 SENSITIVE Sensitive     NITROFURANTOIN <=16 SENSITIVE Sensitive     OXACILLIN <=0.25 SENSITIVE Sensitive     TETRACYCLINE <=1 SENSITIVE Sensitive     VANCOMYCIN 1 SENSITIVE Sensitive     TRIMETH/SULFA <=10 SENSITIVE Sensitive     CLINDAMYCIN <=0.25 SENSITIVE Sensitive     RIFAMPIN <=0.5 SENSITIVE Sensitive     Inducible Clindamycin NEGATIVE Sensitive     * >=100,000 COLONIES/mL STAPHYLOCOCCUS SPECIES (COAGULASE NEGATIVE)  MRSA PCR Screening     Status: None   Collection Time: 12/02/16  6:36 AM  Result Value Ref Range Status   MRSA by PCR NEGATIVE NEGATIVE Final    Comment:        The GeneXpert MRSA Assay (FDA approved for NASAL specimens only), is one component of a comprehensive MRSA colonization surveillance program. It is not intended to diagnose MRSA infection nor to guide or monitor treatment for MRSA infections.   Culture, blood (routine x 2)     Status: None (Preliminary result)   Collection Time: 12/05/16  4:14 AM  Result Value Ref Range Status   Specimen Description BLOOD RIGHT HAND  Final   Special Requests   Final    BOTTLES DRAWN AEROBIC AND ANAEROBIC Blood Culture adequate volume   Culture NO GROWTH 4 DAYS  Final   Report Status PENDING  Incomplete  Culture, blood (routine x 2)      Status: None (Preliminary result)   Collection Time: 12/05/16  6:19 AM  Result Value Ref Range Status   Specimen Description BLOOD RIGHT HAND  Final   Special Requests   Final    BOTTLES DRAWN AEROBIC ONLY Blood Culture adequate volume  Culture NO GROWTH 4 DAYS  Final   Report Status PENDING  Incomplete     Labs: CBC:  Recent Labs Lab 12/04/16 0608 12/06/16 0409 12/09/16 1206  WBC 7.0 8.0 8.8  NEUTROABS  --   --  6,512  HGB 13.9 13.7 14.3  HCT 41.4 40.7 42.8  MCV 83.0 82.7 84.3  PLT 128* 145* 379   Basic Metabolic Panel:  Recent Labs Lab 12/04/16 0608 12/05/16 0414 12/06/16 0409 12/09/16 1206  NA 134* 134* 134* 135  K 3.9 4.0 3.8 4.8  CL 105 106 104 104  CO2 20* 22 22 21   GLUCOSE 181* 186* 172* 152*  BUN 18 16 19 17   CREATININE 1.18 1.13 1.22 1.11  CALCIUM 9.6 9.8 9.8 10.5*   Liver Function Tests:  Recent Labs Lab 12/09/16 1206  AST 28  ALT 14  ALKPHOS 122*  BILITOT 0.4  PROT 7.0  ALBUMIN 3.8   No results for input(s): LIPASE, AMYLASE in the last 168 hours. No results for input(s): AMMONIA in the last 168 hours. Cardiac Enzymes: No results for input(s): CKTOTAL, CKMB, CKMBINDEX, TROPONINI in the last 168 hours. BNP (last 3 results) No results for input(s): BNP in the last 8760 hours. CBG:  Recent Labs Lab 12/06/16 1849 12/06/16 2058 12/07/16 0756 12/07/16 1218 12/07/16 1714  GLUCAP 234* 238* 124* 170* 79   Time spent: 35 minutes  Signed:  Rawley Harju  [facility] 12/07/2016 , 9:39 AM

## 2016-12-10 NOTE — ED Triage Notes (Signed)
Here for persistent neck pain   Seen here on 11/21/16 and also admitted to [facility] on 4/19  A&O x4... NAD

## 2016-12-10 NOTE — Discharge Instructions (Signed)
You no longer need the Valtrex that was prescribed at your visit on April 9th, your primary care provider prescribed Acyclovir at your visit yesterday. This is a much cheaper drug that treats the same condition. This prescription was sent to the [facility] pharmacy yesterday. Take this drug rather than the valtrex that was prescribed earlier this month. For further management of your conditions, follow up with your primary care provider for medication management and coordination of your care.

## 2016-12-12 ENCOUNTER — Telehealth: Payer: Self-pay | Admitting: Family Medicine

## 2016-12-12 MED ORDER — LEVOTHYROXINE SODIUM 50 MCG PO TABS: 50.0000 ug | ORAL_TABLET | Freq: Every day | ORAL | 3 refills | Status: DC

## 2016-12-12 NOTE — Telephone Encounter (Signed)
Attempted to call patient regarding his labs from 12/09/16 which indicates hypothyroidism. The phone number on file constantly rings busy. Sending over a prescription for levothyroxine 50 mcg and sending a letter unable to contact.

## 2016-12-15 ENCOUNTER — Encounter (HOSPITAL_COMMUNITY): Payer: Self-pay

## 2016-12-15 ENCOUNTER — Emergency Department (HOSPITAL_COMMUNITY)
Admission: EM | Admit: 2016-12-15 | Discharge: 2016-12-15 | Disposition: A | Discharge status: Home / Self Care | Payer: Medicare Other | Attending: Emergency Medicine | Admitting: Emergency Medicine

## 2016-12-15 DIAGNOSIS — Z4801 Encounter for change or removal of surgical wound dressing: Secondary | ICD-10-CM | POA: Diagnosis not present

## 2016-12-15 DIAGNOSIS — Z79899 Other long term (current) drug therapy: Secondary | ICD-10-CM | POA: Diagnosis not present

## 2016-12-15 DIAGNOSIS — N183 Chronic kidney disease, stage 3 (moderate): Secondary | ICD-10-CM | POA: Insufficient documentation

## 2016-12-15 DIAGNOSIS — I129 Hypertensive chronic kidney disease with stage 1 through stage 4 chronic kidney disease, or unspecified chronic kidney disease: Secondary | ICD-10-CM | POA: Insufficient documentation

## 2016-12-15 DIAGNOSIS — Z794 Long term (current) use of insulin: Secondary | ICD-10-CM | POA: Insufficient documentation

## 2016-12-15 DIAGNOSIS — Z4800 Encounter for change or removal of nonsurgical wound dressing: Secondary | ICD-10-CM

## 2016-12-15 DIAGNOSIS — E1122 Type 2 diabetes mellitus with diabetic chronic kidney disease: Secondary | ICD-10-CM | POA: Insufficient documentation

## 2016-12-15 NOTE — ED Notes (Signed)
Had extensive conversation with [facility]. They will follow up with patient regarding care of PICC line. Pt and son informed of this and reiterated to call [facility].

## 2016-12-15 NOTE — ED Triage Notes (Signed)
Pt is here requesting a dressing change to his PICC line in his right brachial. He states his home health nurse does not take medicaid and he is unable to pay her cash. He states the PICC line works fine.

## 2016-12-15 NOTE — ED Provider Notes (Signed)
[facility] DEPT Provider Note   CSN: [id number] Arrival date & time: 12/15/16  1237     History   Chief Complaint Chief Complaint  Patient presents with  . Dressing Change    HPI Daniel Porter is a 76 y.o. male.  HPI    76 year old male presents today with need for dressing change.  Patient was discharged from the hospital on 12/07/2016 status post sepsis.  PICC line was placed at that time for IV antibiotics.  Patient continues talking about things unrelated to his presentation today.  Per nursing staff patient reports that home health nurse does not take Medicaid and was unable to provide care for him, but also notes that he was receiving care at home.  Patient denies any specific physical complaints or today.   Past Medical History:  Diagnosis Date  . Diabetes mellitus without complication ([place])   . GERD (gastroesophageal reflux disease)   . Hyperlipidemia   . Hypertension   . Initial insomnia   . Umbilical hernia     Patient Active Problem List   Diagnosis Date Noted  . DM2 (diabetes mellitus, type 2) ([place]) 12/02/2016  . Sepsis secondary to UTI ([place]) 12/02/2016  . Pyohydronephrosis 12/02/2016  . Bladder mass 12/02/2016  . CKD (chronic kidney disease) stage 3, GFR 30-59 ml/min 12/02/2016  . Recurrent umbilical hernia 20/94/7096  . S/P umbilical hernia repair, follow-up exam 11/30/2011  . Umbilical hernia 28/36/6294  . Right inguinal hernia 10/12/2011    Past Surgical History:  Procedure Laterality Date  . HERNIA REPAIR     2  . UMBILICAL HERNIA REPAIR  11/22/2011   Procedure: HERNIA REPAIR UMBILICAL ADULT;  Surgeon: Zenovia Jarred, MD;  Location: [facility];  Service: General;  Laterality: N/A;     Home Medications    Prior to Admission medications   Medication Sig Start Date End Date Taking? Authorizing Provider  acyclovir (ZOVIRAX) 800 MG tablet Take 1 tablet (800 mg total) by mouth 5 (five) times daily. 12/09/16 12/16/16  Scot Jun, FNP    amLODipine (NORVASC) 5 MG tablet Take 1 tablet (5 mg total) by mouth daily. 12/08/16   Lavina Hamman, MD  artificial tears (LACRILUBE) OINT ophthalmic ointment Place into the right eye every 3 (three) hours as needed for dry eyes. 11/21/16   Janne Napoleon, NP  blood glucose meter kit and supplies Dispense based on patient and insurance preference. Use up to four times daily as directed. (FOR ICD-9 250.00, 250.01). 12/07/16   Lavina Hamman, MD  Blood Pressure Monitoring (BLOOD PRESSURE CUFF) MISC Check blood pressure once daily. If readings greater than 150/90 please phone office 12/09/16   Scot Jun, FNP  ceFAZolin (ANCEF) IVPB Inject 2 g into the vein every 8 (eight) hours. Indication:  bacteremia Last Day of Therapy:  12/18/2016 Labs - Once weekly:  CBC/D and BMP, Labs - Every other week:  ESR and CRP 12/06/16   Albertine Patricia, MD  finasteride (PROSCAR) 5 MG tablet Take 1 tablet (5 mg total) by mouth daily. 12/08/16   Lavina Hamman, MD  ibuprofen (ADVIL,MOTRIN) 200 MG tablet Take 400 mg by mouth every 6 (six) hours as needed for headache or moderate pain.    Historical Provider, MD  insulin aspart protamine- aspart (NOVOLOG MIX 70/30) (70-30) 100 UNIT/ML injection Inject 0.12 mLs (12 Units total) into the skin 2 (two) times daily with a meal. 12/09/16   Scot Jun, FNP  Insulin Syringes, Disposable, U-100 0.3  ML MISC 1 Act by Does not apply route 4 (four) times daily. 12/07/16   Lavina Hamman, MD  levothyroxine (SYNTHROID, LEVOTHROID) 50 MCG tablet Take 1 tablet (50 mcg total) by mouth daily. 12/12/16   Scot Jun, FNP  OVER THE COUNTER MEDICATION Take 2 tablets by mouth every 6 (six) hours as needed (for pain). OTC Back and Body Aspirin    Historical Provider, MD  tamsulosin (FLOMAX) 0.4 MG CAPS capsule Take 1 capsule (0.4 mg total) by mouth daily after supper. 12/07/16   Lavina Hamman, MD    Family History No family history on file.  Social History Social History  Substance  Use Topics  . Smoking status: Never Smoker  . Smokeless tobacco: Never Used  . Alcohol use No     Allergies   Pork-derived products   Review of Systems Review of Systems  All other systems reviewed and are negative.    Physical Exam Updated Vital Signs BP (!) 148/98 (BP Location: Left Arm)   Pulse 88   Temp 97.7 F (36.5 C) (Oral)   Resp 18   Ht _0  (1.727 m)   Wt 86.2 kg   SpO2 98%   BMI 28.89 kg/m   Physical Exam  Constitutional: He is oriented to person, place, and time. He appears well-developed and well-nourished.  HENT:  Head: Normocephalic and atraumatic.  Eyes: Conjunctivae are normal. Pupils are equal, round, and reactive to light. Right eye exhibits no discharge. Left eye exhibits no discharge. No scleral icterus.  Neck: Normal range of motion. No JVD present. No tracheal deviation present.  Pulmonary/Chest: Effort normal. No stridor.  Musculoskeletal:  Right upper extremity PICC line clean with no signs of surrounding infection  Neurological: He is alert and oriented to person, place, and time. Coordination normal.  Psychiatric: He has a normal mood and affect. His behavior is normal. Judgment and thought content normal.  Nursing note and vitals reviewed.    ED Treatments / Results  Labs (all labs ordered are listed, but only abnormal results are displayed) Labs Reviewed - No data to display  EKG  EKG Interpretation None       Radiology No results found.  Procedures Procedures (including critical care time)  Medications Ordered in ED Medications - No data to display   Initial Impression / Assessment and Plan / ED Course  I have reviewed the triage vital signs and the nursing notes.  Pertinent labs & imaging results that were available during my care of the patient were reviewed by me and considered in my medical decision making (see chart for details).     Final Clinical Impressions(s) / ED Diagnoses   Final diagnoses:    Dressing change    76 year old male presents today for wound care of his PICC line.  No signs of infection.  No physical complaints here today.  Discharged home with continued outpatient follow-up and return precautions.  New Prescriptions New Prescriptions   No medications on file     Okey Regal, PA-C 12/15/16 [place], MD 12/15/16 2059

## 2016-12-15 NOTE — ED Notes (Signed)
IV TEAM IN

## 2016-12-15 NOTE — Discharge Instructions (Signed)
Please read attached information. If you experience any new or worsening signs or symptoms please return to the emergency room for evaluation. Please follow-up with your primary care provider or specialist as discussed.  °

## 2016-12-16 ENCOUNTER — Encounter: Payer: Self-pay | Admitting: Infectious Diseases

## 2016-12-21 ENCOUNTER — Telehealth: Payer: Self-pay | Admitting: Infectious Diseases

## 2016-12-21 ENCOUNTER — Telehealth: Payer: Self-pay | Admitting: Pharmacist

## 2016-12-21 NOTE — Telephone Encounter (Signed)
Called by home health.  Pt has elevated K+ but is refusing repeat blood draw.  He also is refusing to have his pic removed.  He is refusing home health.  He is going to ED for repeat labs.  I reiterated he needs to have his PIC removed.

## 2016-12-21 NOTE — Telephone Encounter (Signed)
Called and [facility] verbal [facility], to repeat patient's potassium as it was >10 on last lab draw but with cell contact.  Just repeating to verify that it is ok.  Will repeat today per Coretta.  Also notified that the patient does not want to take his 2nd week of IV medication. Alerted Dr. Ninetta LightsHatcher.

## 2016-12-22 ENCOUNTER — Encounter (HOSPITAL_COMMUNITY): Payer: Self-pay

## 2016-12-22 ENCOUNTER — Emergency Department (HOSPITAL_COMMUNITY)
Admission: EM | Admit: 2016-12-22 | Discharge: 2016-12-22 | Disposition: A | Discharge status: Home / Self Care | Payer: Medicare Other | Attending: Emergency Medicine | Admitting: Emergency Medicine

## 2016-12-22 DIAGNOSIS — Z794 Long term (current) use of insulin: Secondary | ICD-10-CM | POA: Insufficient documentation

## 2016-12-22 DIAGNOSIS — I129 Hypertensive chronic kidney disease with stage 1 through stage 4 chronic kidney disease, or unspecified chronic kidney disease: Secondary | ICD-10-CM | POA: Insufficient documentation

## 2016-12-22 DIAGNOSIS — N183 Chronic kidney disease, stage 3 (moderate): Secondary | ICD-10-CM | POA: Insufficient documentation

## 2016-12-22 DIAGNOSIS — E1122 Type 2 diabetes mellitus with diabetic chronic kidney disease: Secondary | ICD-10-CM | POA: Diagnosis not present

## 2016-12-22 DIAGNOSIS — Z452 Encounter for adjustment and management of vascular access device: Secondary | ICD-10-CM

## 2016-12-22 LAB — I-STAT CHEM 8, ED
BUN: 15 mg/dL (ref 6–20)
CALCIUM ION: 1.39 mmol/L (ref 1.15–1.40)
Chloride: 107 mmol/L (ref 101–111)
Creatinine, Ser: 1.3 mg/dL — ABNORMAL HIGH (ref 0.61–1.24)
Glucose, Bld: 148 mg/dL — ABNORMAL HIGH (ref 65–99)
HCT: 39 % (ref 39.0–52.0)
Hemoglobin: 13.3 g/dL (ref 13.0–17.0)
Potassium: 4.2 mmol/L (ref 3.5–5.1)
SODIUM: 139 mmol/L (ref 135–145)
TCO2: 24 mmol/L (ref 0–100)

## 2016-12-22 NOTE — ED Triage Notes (Signed)
Pt presents to the ed for a dressing change on his picc line, states that his home health nurse would not change it.  This is his second visit for a dressing change to the ed. Denies any complaints

## 2016-12-22 NOTE — ED Notes (Addendum)
Home health nurse called to say that the patient needs to have his picc line taken out. Apparently the patient refused to let them take it out at home.  Please see Dr. Moshe CiproHatcher's note

## 2016-12-22 NOTE — Discharge Instructions (Signed)
Follow-up with your doctors as directed. PICC line removed by IV nurses. Today's labs with normal potassium.

## 2016-12-22 NOTE — ED Provider Notes (Signed)
[facility] DEPT Provider Note   CSN: [id number] Arrival date & time: 12/22/16  1427   By signing my name below, I, Daniel Porter, attest that this documentation has been prepared under the direction and in the presence of Daniel Sorrow, MD . Electronically Signed: Neta Porter, ED Scribe. 12/22/2016. 5:49 PM.    History   Chief Complaint Chief Complaint  Patient presents with  . picc line dressing change    The history is provided by the patient. No language interpreter was used.   HPI Comments:  Daniel Porter is a 76 y.o. male who presents to the Emergency Department, here for a PICC line dressing change. Per Dr. Algis Downs note, pt can now have the PICC removed. Pt was using a PICC to receive ancef for bacteremia. Pt complains of associated headache and blurred vision. He states that he is in need of corrective lenses. No alleviating factors noted.  Denies fever, chills, cough, rhinorrhea, sore throat, visual changes, SOB, chest pain, leg swelling, abdominal pain, nausea, vomiting, diarrhea, dysuria, hematuria, back pain, neck pain, rash, headaches, and bleeding easily.   Past Medical History:  Diagnosis Date  . Diabetes mellitus without complication ([place])   . GERD (gastroesophageal reflux disease)   . Hyperlipidemia   . Hypertension   . Initial insomnia   . Umbilical hernia     Patient Active Problem List   Diagnosis Date Noted  . DM2 (diabetes mellitus, type 2) ([place]) 12/02/2016  . Sepsis secondary to UTI ([place]) 12/02/2016  . Pyohydronephrosis 12/02/2016  . Bladder mass 12/02/2016  . CKD (chronic kidney disease) stage 3, GFR 30-59 ml/min 12/02/2016  . Recurrent umbilical hernia 52/84/1324  . S/P umbilical hernia repair, follow-up exam 11/30/2011  . Umbilical hernia 40/05/2724  . Right inguinal hernia 10/12/2011    Past Surgical History:  Procedure Laterality Date  . HERNIA REPAIR     2  . UMBILICAL HERNIA REPAIR  11/22/2011   Procedure: HERNIA  REPAIR UMBILICAL ADULT;  Surgeon: Zenovia Jarred, MD;  Location: [facility];  Service: General;  Laterality: N/A;       Home Medications    Prior to Admission medications   Medication Sig Start Date End Date Taking? Authorizing Provider  amLODipine (NORVASC) 5 MG tablet Take 1 tablet (5 mg total) by mouth daily. 12/08/16   Lavina Hamman, MD  artificial tears (LACRILUBE) OINT ophthalmic ointment Place into the right eye every 3 (three) hours as needed for dry eyes. 11/21/16   Janne Napoleon, NP  blood glucose meter kit and supplies Dispense based on patient and insurance preference. Use up to four times daily as directed. (FOR ICD-9 250.00, 250.01). 12/07/16   Lavina Hamman, MD  Blood Pressure Monitoring (BLOOD PRESSURE CUFF) MISC Check blood pressure once daily. If readings greater than 150/90 please phone office 12/09/16   Scot Jun, FNP  ceFAZolin (ANCEF) IVPB Inject 2 g into the vein every 8 (eight) hours. Indication:  bacteremia Last Day of Therapy:  12/18/2016 Labs - Once weekly:  CBC/D and BMP, Labs - Every other week:  ESR and CRP 12/06/16   Elgergawy, Silver Huguenin, MD  finasteride (PROSCAR) 5 MG tablet Take 1 tablet (5 mg total) by mouth daily. 12/08/16   Lavina Hamman, MD  ibuprofen (ADVIL,MOTRIN) 200 MG tablet Take 400 mg by mouth every 6 (six) hours as needed for headache or moderate pain.    [provider]  insulin aspart protamine- aspart (NOVOLOG MIX 70/30) (70-30) 100  UNIT/ML injection Inject 0.12 mLs (12 Units total) into the skin 2 (two) times daily with a meal. 12/09/16   Scot Jun, FNP  Insulin Syringes, Disposable, U-100 0.3 ML MISC 1 Act by Does not apply route 4 (four) times daily. 12/07/16   Lavina Hamman, MD  levothyroxine (SYNTHROID, LEVOTHROID) 50 MCG tablet Take 1 tablet (50 mcg total) by mouth daily. 12/12/16   Scot Jun, FNP  OVER THE COUNTER MEDICATION Take 2 tablets by mouth every 6 (six) hours as needed (for pain). OTC Back and Body  Aspirin    [provider]  tamsulosin (FLOMAX) 0.4 MG CAPS capsule Take 1 capsule (0.4 mg total) by mouth daily after supper. 12/07/16   Lavina Hamman, MD    Family History No family history on file.  Social History Social History  Substance Use Topics  . Smoking status: Never Smoker  . Smokeless tobacco: Never Used  . Alcohol use No     Allergies   Pork-derived products   Review of Systems Review of Systems  Constitutional: Negative for chills and fever.  HENT: Negative for rhinorrhea and sore throat.   Eyes: Positive for visual disturbance.  Respiratory: Negative for cough and shortness of breath.   Cardiovascular: Negative for chest pain and leg swelling.  Gastrointestinal: Negative for abdominal distention, abdominal pain, diarrhea, nausea and vomiting.  Genitourinary: Negative for dysuria and hematuria.  Musculoskeletal: Negative for back pain, myalgias and neck pain.  Skin: Negative for rash.  Neurological: Positive for headaches. Negative for numbness.  Hematological: Does not bruise/bleed easily.  Psychiatric/Behavioral: Negative for confusion.     Physical Exam Updated Vital Signs BP (!) 148/83   Pulse 72   Temp 98.2 F (36.8 C) (Oral)   Resp 18   SpO2 98%   Physical Exam  Constitutional: He appears well-developed and well-nourished. No distress.  HENT:  Head: Normocephalic and atraumatic.  Eyes: Conjunctivae are normal.  Cardiovascular: Normal rate, regular rhythm and normal heart sounds.   Pulmonary/Chest: Effort normal and breath sounds normal. He has no wheezes. He has no rales.  Abdominal: Bowel sounds are normal. He exhibits no distension. There is no tenderness.  Wrap around for abdominal hernia in place.   Neurological: He is alert.  Skin: Skin is warm and dry.  Psychiatric: He has a normal mood and affect.  Nursing note and vitals reviewed.    ED Treatments / Results  DIAGNOSTIC STUDIES:  Oxygen Saturation is 98% on RA,  normal by my interpretation.    COORDINATION OF CARE:  5:42 PM Discussed treatment plan with pt at bedside and pt agreed to plan.   Labs (all labs ordered are listed, but only abnormal results are displayed) Labs Reviewed  I-STAT CHEM 8, ED - Abnormal; Notable for the following:       Result Value   Creatinine, Ser 1.30 (*)    Glucose, Bld 148 (*)    All other components within normal limits    EKG  EKG Interpretation None       Radiology No results found.  Procedures Procedures (including critical care time)  Medications Ordered in ED Medications - No data to display   Initial Impression / Assessment and Plan / ED Course  I have reviewed the triage vital signs and the nursing notes.  Pertinent labs & imaging results that were available during my care of the patient were reviewed by me and considered in my medical decision making (see chart for details).  Patient had a PICC line in place for treatment for pyelonephritis. No longer required. Patient had been resistant to have it removed. Patient willing today. Red infectious disease notes. They're stating he can be removed. The other concern was he had an elevated potassium by the home nurse. Today's potassium electrolytes are normal. Patient nontoxic no acute distress. IV nurse will remove the PICC line. Patient stable for discharge home and follow-up.  Final Clinical Impressions(s) / ED Diagnoses   Final diagnoses:  PICC (peripherally inserted central catheter) removal    New Prescriptions New Prescriptions   No medications on file  I personally performed the services described in this documentation, which was scribed in my presence. The recorded information has been reviewed and is accurate.        Daniel Sorrow, MD 12/22/16 2021

## 2016-12-22 NOTE — Progress Notes (Signed)
Instructed pt on procedure with son interpreting. Son was able to verbalize back instructions to nurse. HOB less than 45 degrees. Pt held breath upon line removal. No s/sx of complication. Held pressure to site for 6 min. Instructed to report s/sx of bleeding and to report and to remain lying down for 30 min. And to keep drsg CDI for 24 hours. PT and son VU. Unit nurse notified. Tomasita MorrowHeather Warden Buffa, RN VAST

## 2017-01-17 ENCOUNTER — Ambulatory Visit (HOSPITAL_COMMUNITY)
Admission: EM | Admit: 2017-01-17 | Discharge: 2017-01-17 | Disposition: A | Discharge status: Home / Self Care | Payer: Medicare Other | Attending: Internal Medicine | Admitting: Internal Medicine

## 2017-01-17 ENCOUNTER — Encounter (HOSPITAL_COMMUNITY): Payer: Self-pay | Admitting: Emergency Medicine

## 2017-01-17 DIAGNOSIS — Z760 Encounter for issue of repeat prescription: Secondary | ICD-10-CM

## 2017-01-17 DIAGNOSIS — I10 Essential (primary) hypertension: Secondary | ICD-10-CM

## 2017-01-17 MED ORDER — TAMSULOSIN HCL 0.4 MG PO CAPS: 0.4000 mg | ORAL_CAPSULE | Freq: Every day | ORAL | 1 refills | Status: AC

## 2017-01-17 MED ORDER — FINASTERIDE 5 MG PO TABS: 5.0000 mg | ORAL_TABLET | Freq: Every day | ORAL | 1 refills | Status: DC

## 2017-01-17 MED ORDER — AMLODIPINE BESYLATE 5 MG PO TABS: 5.0000 mg | ORAL_TABLET | Freq: Every day | ORAL | 1 refills | Status: AC

## 2017-01-17 NOTE — ED Provider Notes (Signed)
CSN: [id number]     Arrival date & time 01/17/17  1009 History   None    Chief Complaint  Patient presents with  . Medication Refill   (Consider location/radiation/quality/duration/timing/severity/associated sxs/prior Treatment) The history is provided by the patient. No language interpreter was used.  Medication Refill  Medications/supplies requested:  Norvasc,proscar,tamsulosin  Reason for request:  Clinic/provider not available and medications ran out Medications taken before: yes - see home medications   Patient has complete original prescription information: yes     Past Medical History:  Diagnosis Date  . Diabetes mellitus without complication ([facility])   . GERD (gastroesophageal reflux disease)   . Hyperlipidemia   . Hypertension   . Initial insomnia   . Umbilical hernia    Past Surgical History:  Procedure Laterality Date  . HERNIA REPAIR     2  . UMBILICAL HERNIA REPAIR  11/22/2011   Procedure: HERNIA REPAIR UMBILICAL ADULT;  Surgeon: Liz MaladyBurke E Thompson, MD;  [facility];  Service: General;  Laterality: N/A;   History reviewed. No pertinent family history. Social History  Substance Use Topics  . Smoking status: Never Smoker  . Smokeless tobacco: Never Used  . Alcohol use No    Review of Systems  All other systems reviewed and are negative.   Allergies  Pork-derived products  Home Medications   Prior to Admission medications   Medication Sig Start Date End Date Taking? Authorizing Provider  ibuprofen (ADVIL,MOTRIN) 200 MG tablet Take 400 mg by mouth every 6 (six) hours as needed for headache or moderate pain.   Yes [provider]  amLODipine (NORVASC) 5 MG tablet Take 1 tablet (5 mg total) by mouth daily. 01/17/17   Elson AreasSofia, Petra Dumler K, PA-C  finasteride (PROSCAR) 5 MG tablet Take 1 tablet (5 mg total) by mouth daily. 01/17/17   Elson AreasSofia, Bentlie Catanzaro K, PA-C  tamsulosin (FLOMAX) 0.4 MG CAPS capsule Take 1 capsule (0.4 mg total) by mouth daily after supper. 01/17/17    Elson AreasSofia, Maricia Scotti K, PA-C   Meds Ordered and Administered this Visit  Medications - No data to display  BP (!) 150/78 (BP Location: Right Arm)   Pulse 77   Temp 97.5 F (36.4 C) (Oral)   Resp 18   SpO2 97%  No data found.   Physical Exam  Constitutional: He appears well-developed and well-nourished.  HENT:  Head: Normocephalic and atraumatic.  Eyes: Conjunctivae are normal.  Neck: Neck supple.  Cardiovascular: Normal rate and regular rhythm.   No murmur heard. Pulmonary/Chest: Effort normal and breath sounds normal. No respiratory distress.  Abdominal: Soft. There is no tenderness.  Musculoskeletal: He exhibits no edema.  Neurological: He is alert.  Skin: Skin is warm and dry.  Psychiatric: He has a normal mood and affect.  Nursing note and vitals reviewed.   Urgent Care Course     Procedures (including critical care time)  Labs Review Labs Reviewed - No data to display  Imaging Review No results found.   Visual Acuity Review  Right Eye Distance:   Left Eye Distance:   Bilateral Distance:    Right Eye Near:   Left Eye Near:    Bilateral Near:       Pt advised to follow up with his primary care MD.  MDM   1. Medication refill    An After Visit Summary was printed and given to the patient. Meds ordered this encounter  Medications  . finasteride (PROSCAR) 5 MG tablet    Sig: Take  1 tablet (5 mg total) by mouth daily.    Dispense:  30 tablet    Refill:  1    Order Specific Question:   Supervising Provider    Answer:   Eustace Moore [[id number]]  . amLODipine (NORVASC) 5 MG tablet    Sig: Take 1 tablet (5 mg total) by mouth daily.    Dispense:  30 tablet    Refill:  1    Order Specific Question:   Supervising Provider    Answer:   Eustace Moore [[id number]]  . tamsulosin (FLOMAX) 0.4 MG CAPS capsule    Sig: Take 1 capsule (0.4 mg total) by mouth daily after supper.    Dispense:  30 capsule    Refill:  1    Order Specific Question:   Supervising  Provider    Answer:   Eustace Moore [[id number]]      Elson Areas, PA-C 01/17/17 1540    Elson Areas, [place] 01/17/17 1556

## 2017-01-17 NOTE — ED Triage Notes (Signed)
Patient here to have medication refills on amlodipine, finasteride, and tamsulosin

## 2017-01-21 ENCOUNTER — Ambulatory Visit (HOSPITAL_COMMUNITY)
Admission: EM | Admit: 2017-01-21 | Discharge: 2017-01-21 | Disposition: A | Discharge status: Home / Self Care | Payer: Medicare Other | Attending: Internal Medicine | Admitting: Internal Medicine

## 2017-01-21 ENCOUNTER — Encounter (HOSPITAL_COMMUNITY): Payer: Self-pay | Admitting: Family Medicine

## 2017-01-21 DIAGNOSIS — H538 Other visual disturbances: Secondary | ICD-10-CM

## 2017-01-21 NOTE — Discharge Instructions (Signed)
°No Primary Care Doctor: °Call [facility] at  [phone] - they can help you locate a primary care doctor that  accepts your insurance, provides certain services, etc. °Physician Referral Service- 1-800-533-3463 ° °Emergency Department Resource Guide °1) Find a Doctor and Pay Out of Pocket °Although you won't have to find out who is covered by your insurance plan, it is a good idea to ask around and get recommendations. You will then need to call the office and see if the doctor you have chosen will accept you as a new patient and what types of options they offer for patients who are self-pay. Some doctors offer discounts or will set up payment plans for their patients who do not have insurance, but you will need to ask so you aren't surprised when you get to your appointment. ° °2) Contact Your Local Health Department °Not all health departments have doctors that can see patients for sick visits, but many do, so it is worth a call to see if yours does. If you don't know where your local health department is, you can check in your phone book. The CDC also has a tool to help you locate your state's health department, and many state websites also have listings of all of their local health departments. ° °3) Find a Walk-in Clinic °If your illness is not likely to be very severe or complicated, you may want to try a walk in clinic. These are popping up all over the country in pharmacies, drugstores, and shopping centers. They're usually staffed by nurse practitioners or physician assistants that have been trained to treat common illnesses and complaints. They're usually fairly quick and inexpensive. However, if you have serious medical issues or chronic medical problems, these are probably not your best option. ° °No Primary Care Doctor: °Call [facility] at  [phone] - they can help you locate a primary care doctor that  accepts your insurance, provides certain services, etc. °Physician Referral Service-  1-800-533-3463 ° °Chronic Pain Problems: °Organization         Address  Phone   Notes  °[facility]  [phone] Patients need to be referred by their primary care doctor.  ° °Medication Assistance: °Organization         Address  Phone   Notes  °[place] [facility] [address] °[place], [place] [address] [phone] --Must be a resident of [place] °-- Must have NO insurance coverage whatsoever (no Medicaid/ Medicare, etc.) °-- The pt. MUST have a primary care doctor that directs their care regularly and follows them in the community °  °[facility]  ([phone]   °[employer]  (888) 892-1162   ° °Agencies that provide inexpensive medical care: °Organization         Address                                                       Phone                                                                              Notes  [place]  [phone]   [facility]    [phone]   [facility], [place] [address] [phone]   [place] [address], [place] [phone]   Planned Parenthood    [phone]   [facility]    [phone]   [place] and [place] [address], [place] Phone:  [phone], Fax:  [phone] Hours of Operation:  9 am - 6 pm, M-F.  Also accepts Medicaid/Medicare and self-pay.  [facility] for [place] [address], [address], [place] Phone: [phone], Fax: [phone]. Hours of Operation:  8:30 am - 5:30 pm, M-F.  Also accepts Medicaid and self-pay.  [facility] [place] [address] [place] Phone: [phone]   [place], [place], [place] [phone], Ext. [phone] Mondays & Thursdays: 7-9 AM.  First 15 patients are seen on a first come, first serve basis.    [place] Providers:  Organization         Address                                                                       Phone                               Notes  [facility] [address], [place] [phone] Also accepts self-pay patients.  [facility] [address], [place]  [phone]   [place], [address], [place] [phone]   [facility] [address] [place] [phone]   Lucianne Lei [address], [place]   [phone] Only accepts [place] Access [place] patients after they have their name applied to their card.   Self-Pay (no insurance) in [facility]:   Organization         Address                                                     Phone               Notes  Sickle Cell Patients, [facility] [place] [phone]   [facility] [place] [phone]   [facility]  [place], [place], [place] [phone]   [facility]/Dr. Osei-Bonsu  [address], [place] or [address], [place] [phone] Phone number for both [place] and [place] locations is the same.  Urgent Medical and [facility] [address], [place] [phone]   [facility] [address], [place] or [address] [phone]   [facility] [place] [phone], phone; [phone], fax Sees patients 1st and 3rd Saturday of every month.  Must not qualify for public or private insurance (i.e. Medicaid, Medicare, [place] Health Choice, Veterans' Benefits)  Household income should be no more than 200% of the poverty level The clinic cannot treat you if you are pregnant or think you are pregnant  Sexually  transmitted diseases are not treated at the clinic.    Dental Care: Organization         Address                                  Phone                       Notes  [facility] Department of [facility] [phone] Accepts children up to age 12 who are enrolled in [place] or [place]; pregnant women with a Medicaid card; and children who have applied for Medicaid or [place] Health Choice, but were declined, whose parents can pay a reduced fee at time of service.  [facility] Department of [facility]  [address], [place] [phone] Accepts children up to age 70 who are enrolled in [place] or [place]; pregnant women with a Medicaid card; and children who have applied for Medicaid or [place] Health Choice, but were declined, whose parents can pay a reduced fee at time of service.  [facility]  [place] [phone] Patients are seen by appointment only. Walk-ins are not accepted. [place] will see patients 6 years of age and older. Monday - Tuesday (8am-5pm) Most Wednesdays (8:30-5pm) $30 per visit, cash only  [facility] Adult Dental Access PROGRAM  [address], [facility] [phone] Patients are seen by appointment only. Walk-ins are not accepted. [place] will see patients 69 years of age and older. One Wednesday Evening (Monthly: Volunteer Based).  $30 per visit, cash only  [place]  [phone] for adults; Children under age 67, call Graduate Pediatric Dentistry at [phone]. Children aged 39-14, please call [phone] to request a pediatric application.  Dental services are provided in all areas of dental care including fillings, crowns and bridges, complete and partial dentures, implants, gum treatment, root canals, and extractions. Preventive care is also provided. Treatment is  provided to both adults and children. Patients are selected via a lottery and there is often a waiting list.   [facility] [address], [place]  [phone] [email or web address]   [facility] [address] [place], [place] [phone], Ext. [phone] Second and Fourth Thursday of each month, opens at 6:30 AM; Clinic ends at 9 AM.  Patients are seen on a first-come first-served basis, and a limited number are seen during each clinic.   [facility]  [address] [place], [place] [phone]   Eligibility Requirements You must have lived in [place], [place], or [place] counties for at least the last three months.   You cannot be eligible for state or federal sponsored [employer], including [employer], [place], or [employer].   You generally cannot be eligible for healthcare insurance through your employer.    How to apply: Eligibility screenings are held every  Tuesday and Wednesday afternoon from 1:00 pm until 4:00 pm. You do not need an appointment for the interview!  [facility] [address], [place], [place] [phone]   [facility]  [phone]   [facility]  [phone]   [facility]  [phone]

## 2017-01-21 NOTE — ED Provider Notes (Signed)
CSN: [id number]     Arrival date & time 01/21/17  1231 History   First MD Initiated Contact with Patient 01/21/17 1358     Chief Complaint  Patient presents with  . Eye Problem   (Consider location/radiation/quality/duration/timing/severity/associated sxs/prior Treatment) HPI  Daniel Porter is a 76 y.o. male presenting to UC with grandson with c/o gradually worsening vision in both eyes for at least 3-4 months, worse in Left eye.  He use to have glasses but states he lost them and needs more.  He has been seen by an eye specialist who told him he would need eye surgery but states it is dangerous at his age.  Pt notes the eye specialist he use to see has moved offices, he is unsure where he is now.   He is requesting a referral to a new eye specialist where he can be prescribed affordable glasses.  Denies pain, fever, drainage from eyes, ear pain, sore throat, or change in balance.    Past Medical History:  Diagnosis Date  . Diabetes mellitus without complication (HCC)   . GERD (gastroesophageal reflux disease)   . Hyperlipidemia   . Hypertension   . Initial insomnia   . Umbilical hernia    Past Surgical History:  Procedure Laterality Date  . HERNIA REPAIR     2  . UMBILICAL HERNIA REPAIR  11/22/2011   Procedure: HERNIA REPAIR UMBILICAL ADULT;  Surgeon: Liz Malady, MD;  Location: [facility] OR;  Service: General;  Laterality: N/A;   History reviewed. No pertinent family history. Social History  Substance Use Topics  . Smoking status: Never Smoker  . Smokeless tobacco: Never Used  . Alcohol use No    Review of Systems  Constitutional: Negative for chills and fever.  Eyes: Positive for visual disturbance. Negative for photophobia, pain, discharge, redness and itching.  Neurological: Negative for dizziness, light-headedness and headaches.    Allergies  Pork-derived products  Home Medications   Prior to Admission medications   Medication Sig Start Date End Date Taking?  Authorizing Provider  amLODipine (NORVASC) 5 MG tablet Take 1 tablet (5 mg total) by mouth daily. 01/17/17   Elson Areas, PA-C  finasteride (PROSCAR) 5 MG tablet Take 1 tablet (5 mg total) by mouth daily. 01/17/17   Elson Areas, PA-C  ibuprofen (ADVIL,MOTRIN) 200 MG tablet Take 400 mg by mouth every 6 (six) hours as needed for headache or moderate pain.    [provider]  tamsulosin (FLOMAX) 0.4 MG CAPS capsule Take 1 capsule (0.4 mg total) by mouth daily after supper. 01/17/17   Elson Areas, PA-C   Meds Ordered and Administered this Visit  Medications - No data to display  BP (!) 115/54 (BP Location: Right Arm) Comment: rn notified  Pulse 65   Temp 98.1 F (36.7 C) (Oral)   Resp 14   SpO2 98%  No data found.   Physical Exam  Constitutional: He is oriented to person, place, and time. He appears well-developed and well-nourished.  HENT:  Head: Normocephalic and atraumatic.  Eyes: Conjunctivae, EOM and lids are normal. Pupils are equal, round, and reactive to light. Right eye exhibits no discharge. Left eye exhibits no discharge. No scleral icterus.  Neck: Normal range of motion.  Cardiovascular: Normal rate.   Pulmonary/Chest: Effort normal.  Musculoskeletal: Normal range of motion.  Neurological: He is alert and oriented to person, place, and time.  Skin: Skin is warm and dry.  Psychiatric: He has a normal  mood and affect. His behavior is normal.  Nursing note and vitals reviewed.   Urgent Care Course     Procedures (including critical care time)  Labs Review Labs Reviewed - No data to display  Imaging Review No results found.   Visual Acuity Review  Right Eye Distance:   Left Eye Distance:   Bilateral Distance:    Right Eye Near: R Near: 20/70 Left Eye Near:  L Near: 20/200 Bilateral Near:  20/40   MDM   1. Blurred vision, bilateral    Pt c/o worsening blurred vision w/o other symptoms.  Exam in UC unremarkable. Due to chronicity of  worsening vision changes, changes likely due to older age. No evidence of urgent or emergent process going on at this time. Resource guide provided to help find a new eye specialist for glasses.    Junius FinnerO'Malley, Thorsten Climer, PA-C 01/21/17 1432

## 2017-01-21 NOTE — ED Triage Notes (Signed)
Pt here for vision problems in the right eye. sts that this has been going on since April. sts using glasses without help.

## 2017-03-10 ENCOUNTER — Ambulatory Visit: Payer: Medicaid Other | Admitting: Family Medicine

## 2017-03-20 ENCOUNTER — Emergency Department (HOSPITAL_COMMUNITY)
Admission: EM | Admit: 2017-03-20 | Discharge: 2017-03-20 | Disposition: A | Discharge status: Home / Self Care | Payer: Medicare Other | Attending: Emergency Medicine | Admitting: Emergency Medicine

## 2017-03-20 ENCOUNTER — Encounter (HOSPITAL_COMMUNITY): Payer: Self-pay | Admitting: Emergency Medicine

## 2017-03-20 DIAGNOSIS — I129 Hypertensive chronic kidney disease with stage 1 through stage 4 chronic kidney disease, or unspecified chronic kidney disease: Secondary | ICD-10-CM | POA: Diagnosis not present

## 2017-03-20 DIAGNOSIS — Z79899 Other long term (current) drug therapy: Secondary | ICD-10-CM | POA: Insufficient documentation

## 2017-03-20 DIAGNOSIS — H538 Other visual disturbances: Secondary | ICD-10-CM | POA: Diagnosis not present

## 2017-03-20 DIAGNOSIS — N183 Chronic kidney disease, stage 3 (moderate): Secondary | ICD-10-CM | POA: Insufficient documentation

## 2017-03-20 DIAGNOSIS — E1122 Type 2 diabetes mellitus with diabetic chronic kidney disease: Secondary | ICD-10-CM | POA: Diagnosis not present

## 2017-03-20 DIAGNOSIS — H539 Unspecified visual disturbance: Secondary | ICD-10-CM

## 2017-03-20 LAB — CBG MONITORING, ED: Glucose-Capillary: 334 mg/dL — ABNORMAL HIGH (ref 65–99)

## 2017-03-20 MED ORDER — FLUORESCEIN SODIUM 0.6 MG OP STRP
1.0000 | ORAL_STRIP | Freq: Once | OPHTHALMIC | Status: AC
  Administered 2017-03-20: 1 via OPHTHALMIC
  Filled 2017-03-20: qty 1

## 2017-03-20 MED ORDER — PROPARACAINE HCL 0.5 % OP SOLN
1.0000 [drp] | Freq: Once | OPHTHALMIC | Status: AC
  Administered 2017-03-20: 1 [drp] via OPHTHALMIC
  Filled 2017-03-20: qty 15

## 2017-03-20 NOTE — ED Notes (Signed)
Pt and family understood dc material. NAD noted 

## 2017-03-20 NOTE — ED Provider Notes (Signed)
[facility]PT Provider Note   CSN: [id number] Arrival date & time: 03/20/17  0306     History   Chief Complaint Chief Complaint  Patient presents with  . Eye Pain    HPI Daniel Porter is a 76 y.o. male.  Patient presents with complaint of visual change in his left eye. At one point he states symptoms have been progressive for months and worse in the last week. He denies discharge from the ye at any time. No trauma, swelling, headache or nausea. He reports mild eye pain but not all the time. He states he has an appointment on August 21 with ophthalmology for complete eye exam and a new prescription for glasses. He is requesting "the measurements" for each eye so that he can just have glasses made.    The history is provided by the patient and a relative. Language interpreter used: Son at bedside assists with translation.    Past Medical History:  Diagnosis Date  . Diabetes mellitus without complication (HCC)   . GERD (gastroesophageal reflux disease)   . Hyperlipidemia   . Hypertension   . Initial insomnia   . Umbilical hernia     Patient Active Problem List   Diagnosis Date Noted  . DM2 (diabetes mellitus, type 2) (HCC) 12/02/2016  . Sepsis secondary to UTI (HCC) 12/02/2016  . Pyohydronephrosis 12/02/2016  . Bladder mass 12/02/2016  . CKD (chronic kidney disease) stage 3, GFR 30-59 ml/min 12/02/2016  . Recurrent umbilical hernia 10/17/2012  . S/P umbilical hernia repair, follow-up exam 11/30/2011  . Umbilical hernia 10/12/2011  . Right inguinal hernia 10/12/2011    Past Surgical History:  Procedure Laterality Date  . HERNIA REPAIR     2  . UMBILICAL HERNIA REPAIR  11/22/2011   Procedure: HERNIA REPAIR UMBILICAL ADULT;  Surgeon: Liz MaladyBurke E Thompson, MD;  [facility];  Service: General;  Laterality: N/A;       Home Medications    Prior to Admission medications   Medication Sig Start Date End Date Taking? Authorizing Provider  amLODipine (NORVASC) 5  MG tablet Take 1 tablet (5 mg total) by mouth daily. 01/17/17   Elson AreasSofia, Leslie K, PA-C  finasteride (PROSCAR) 5 MG tablet Take 1 tablet (5 mg total) by mouth daily. 01/17/17   Elson AreasSofia, Leslie K, PA-C  ibuprofen (ADVIL,MOTRIN) 200 MG tablet Take 400 mg by mouth every 6 (six) hours as needed for headache or moderate pain.    [provider]  tamsulosin (FLOMAX) 0.4 MG CAPS capsule Take 1 capsule (0.4 mg total) by mouth daily after supper. 01/17/17   Elson AreasSofia, Leslie K, PA-C    Family History No family history on file.  Social History Social History  Substance Use Topics  . Smoking status: Never Smoker  . Smokeless tobacco: Never Used  . Alcohol use No     Allergies   Pork-derived products   Review of Systems Review of Systems  Constitutional: Negative for chills and fever.  Eyes: Positive for photophobia and visual disturbance. Negative for discharge and redness.  Gastrointestinal: Negative for nausea.  Neurological: Negative for headaches.     Physical Exam Updated Vital Signs BP (!) 163/88   Pulse 72   Temp 97.8 F (36.6 C) (Oral)   Resp 18   SpO2 98%   Physical Exam  Constitutional: He is oriented to person, place, and time. He appears well-developed and well-nourished.  Eyes:  Eye lids bilaterally unremarkable. No redness or swelling. Conjunctiva without injection or redness.  No discharge. Cornea without cloudiness or hyphema. FROM without pain.  Neck: Normal range of motion.  Pulmonary/Chest: Effort normal.  Musculoskeletal: Normal range of motion.  Neurological: He is alert and oriented to person, place, and time.  Skin: Skin is warm and dry.  Psychiatric: He has a normal mood and affect.     ED Treatments / Results  Labs (all labs ordered are listed, but only abnormal results are displayed) Labs Reviewed  CBG MONITORING, ED - Abnormal; Notable for the following:       Result Value   Glucose-Capillary 334 (*)    All other components within normal limits     EKG  EKG Interpretation None       Radiology No results found.  Procedures Procedures (including critical care time)  Medications Ordered in ED Medications  fluorescein ophthalmic strip 1 strip (1 strip Left Eye Given 03/20/17 0409)  proparacaine (ALCAINE) 0.5 % ophthalmic solution 1 drop (1 drop Left Eye Given 03/20/17 0409)     Initial Impression / Assessment and Plan / ED Course  I have reviewed the triage vital signs and the nursing notes.  Pertinent labs & imaging results that were available during my care of the patient were reviewed by me and considered in my medical decision making (see chart for details).     Patient presents with non-acute visual decline in the left eye, with interest in getting glasses that correct his near sighted vision. Eye exam is unremarkable. Functioning tonometer is not available. Doubt acute glaucoma as symptoms have been progressive over time and he does not complain of pain currently.   Discussed that vision measurements to obtain corrective lenses was not available in the ED. Will provide referrals to ophthalmology at patient's request so that he might get in to be seen prior to his scheduled visit later in August.   Final Clinical Impressions(s) / ED Diagnoses   Final diagnoses:  None   1. Declining vision  New Prescriptions New Prescriptions   No medications on file     Elpidio Anis, Cordelia Poche 03/20/17 4098    Gilda Crease, MD 03/20/17 [phone]

## 2017-03-20 NOTE — Discharge Instructions (Signed)
Make an appointment with Dr. Harvel QualeAbugo for further evaluation of declining vision in the left eye.

## 2017-03-20 NOTE — ED Triage Notes (Signed)
Pt c/o 6/10 left eye pain, denies any injury.

## 2017-04-17 ENCOUNTER — Emergency Department (HOSPITAL_COMMUNITY)
Admission: EM | Admit: 2017-04-17 | Discharge: 2017-04-17 | Disposition: A | Discharge status: Home / Self Care | Payer: Medicare Other | Attending: Emergency Medicine | Admitting: Emergency Medicine

## 2017-04-17 ENCOUNTER — Encounter (HOSPITAL_COMMUNITY): Payer: Self-pay | Admitting: *Deleted

## 2017-04-17 DIAGNOSIS — M25511 Pain in right shoulder: Secondary | ICD-10-CM | POA: Diagnosis present

## 2017-04-17 DIAGNOSIS — I129 Hypertensive chronic kidney disease with stage 1 through stage 4 chronic kidney disease, or unspecified chronic kidney disease: Secondary | ICD-10-CM | POA: Diagnosis not present

## 2017-04-17 DIAGNOSIS — N183 Chronic kidney disease, stage 3 (moderate): Secondary | ICD-10-CM | POA: Diagnosis not present

## 2017-04-17 DIAGNOSIS — R739 Hyperglycemia, unspecified: Secondary | ICD-10-CM | POA: Diagnosis not present

## 2017-04-17 DIAGNOSIS — M25512 Pain in left shoulder: Secondary | ICD-10-CM | POA: Diagnosis not present

## 2017-04-17 DIAGNOSIS — G8929 Other chronic pain: Secondary | ICD-10-CM | POA: Diagnosis not present

## 2017-04-17 DIAGNOSIS — M62830 Muscle spasm of back: Secondary | ICD-10-CM | POA: Diagnosis not present

## 2017-04-17 DIAGNOSIS — E1122 Type 2 diabetes mellitus with diabetic chronic kidney disease: Secondary | ICD-10-CM | POA: Insufficient documentation

## 2017-04-17 LAB — URINALYSIS, ROUTINE W REFLEX MICROSCOPIC
BILIRUBIN URINE: NEGATIVE
Glucose, UA: >=500 mg/dL — AB
KETONES UR: NEGATIVE mg/dL
Nitrite: NEGATIVE
Protein, ur: 100 mg/dL — AB
SQUAMOUS EPITHELIAL / LPF: NONE SEEN
Specific Gravity, Urine: 1.02 (ref 1.005–1.030)
pH: 6 (ref 5.0–8.0)

## 2017-04-17 LAB — BASIC METABOLIC PANEL
ANION GAP: 8 (ref 5–15)
BUN: 22 mg/dL — ABNORMAL HIGH (ref 6–20)
CHLORIDE: 103 mmol/L (ref 101–111)
CO2: 22 mmol/L (ref 22–32)
Calcium: 10.6 mg/dL — ABNORMAL HIGH (ref 8.9–10.3)
Creatinine, Ser: 1.58 mg/dL — ABNORMAL HIGH (ref 0.61–1.24)
GFR calc Af Amer: 47 mL/min — ABNORMAL LOW (ref >60)
GFR calc non Af Amer: 41 mL/min — ABNORMAL LOW (ref >60)
GLUCOSE: 353 mg/dL — AB (ref 65–99)
POTASSIUM: 4.4 mmol/L (ref 3.5–5.1)
Sodium: 133 mmol/L — ABNORMAL LOW (ref 135–145)

## 2017-04-17 LAB — CBG MONITORING, ED
GLUCOSE-CAPILLARY: 363 mg/dL — AB (ref 65–99)
Glucose-Capillary: 329 mg/dL — ABNORMAL HIGH (ref 65–99)

## 2017-04-17 MED ORDER — ACETAMINOPHEN 500 MG PO TABS
1000.0000 mg | ORAL_TABLET | Freq: Once | ORAL | Status: AC
  Administered 2017-04-17: 1000 mg via ORAL
  Filled 2017-04-17: qty 2

## 2017-04-17 MED ORDER — LACTATED RINGERS IV BOLUS (SEPSIS): 1000.0000 mL | Freq: Once | INTRAVENOUS | Status: DC

## 2017-04-17 MED ORDER — BACLOFEN 5 MG HALF TABLET
5.0000 mg | ORAL_TABLET | Freq: Once | ORAL | Status: AC
  Administered 2017-04-17: 5 mg via ORAL
  Filled 2017-04-17 (×2): qty 1

## 2017-04-17 NOTE — ED Provider Notes (Signed)
[facility]-EMER[facility]ENCY DEPT Provider Note   CSN: [id number] Arrival date & time: 04/17/17  1714     History   Chief Complaint Chief Complaint  Patient presents with  . Shoulder Pain  . Knee Pain    HPI Daniel Porter is a 76 y.o. male.  This is a 76 year old male with PMH of T2 DM who presents with continued bilateral upper back pain and knee pain.  Patient states it is worse when he is walking up steps.  He states this problem is been ongoing for over 6 months now.  He states he has been taking ibuprofen nearly every day for several weeks. He decided to present to the ED for evaluation and pain control.  He denies any other symptomatology.  Denies abdominal pain, dyspnea, chest pain, change in vision, urinary symptoms, change in bowel movements, numbness and tingling his extremities. Denies recent falls, denies history of joint disease.  Recent travel, denies night sweats or chills or fevers.   The history is provided by the patient. The history is limited by a language barrier. A language interpreter was used.    Past Medical History:  Diagnosis Date  . Diabetes mellitus without complication (HCC)   . GERD (gastroesophageal reflux disease)   . Hyperlipidemia   . Hypertension   . Initial insomnia   . Umbilical hernia     Patient Active Problem List   Diagnosis Date Noted  . DM2 (diabetes mellitus, type 2) (HCC) 12/02/2016  . Sepsis secondary to UTI (HCC) 12/02/2016  . Pyohydronephrosis 12/02/2016  . Bladder mass 12/02/2016  . CKD (chronic kidney disease) stage 3, GFR 30-59 ml/min 12/02/2016  . Recurrent umbilical hernia 10/17/2012  . S/P umbilical hernia repair, follow-up exam 11/30/2011  . Umbilical hernia 10/12/2011  . Right inguinal hernia 10/12/2011    Past Surgical History:  Procedure Laterality Date  . HERNIA REPAIR     2  . UMBILICAL HERNIA REPAIR  11/22/2011   Procedure: HERNIA REPAIR UMBILICAL ADULT;  Surgeon: Liz MaladyBurke E Thompson, MD;  [facility];  Service:  General;  Laterality: N/A;       Home Medications    Prior to Admission medications   Medication Sig Start Date End Date Taking? Authorizing Provider  amLODipine (NORVASC) 5 MG tablet Take 1 tablet (5 mg total) by mouth daily. 01/17/17  Yes Cheron SchaumannSofia, Leslie K, PA-C  finasteride (PROSCAR) 5 MG tablet Take 1 tablet (5 mg total) by mouth daily. 01/17/17  Yes Cheron SchaumannSofia, Leslie K, PA-C  ibuprofen (ADVIL,MOTRIN) 200 MG tablet Take 400 mg by mouth every 6 (six) hours as needed for headache or moderate pain.   Yes [provider]  tamsulosin (FLOMAX) 0.4 MG CAPS capsule Take 1 capsule (0.4 mg total) by mouth daily after supper. 01/17/17  Yes Elson AreasSofia, Leslie K, PA-C    Family History No family history on file.  Social History Social History  Substance Use Topics  . Smoking status: Never Smoker  . Smokeless tobacco: Never Used  . Alcohol use No     Allergies   Pork-derived products   Review of Systems Review of Systems  Constitutional: Negative for chills and fever.  HENT: Negative for ear pain and sore throat.   Eyes: Negative for pain and visual disturbance.  Respiratory: Negative for cough and shortness of breath.   Cardiovascular: Negative for chest pain and palpitations.  Gastrointestinal: Negative for abdominal pain, blood in stool, constipation, diarrhea and vomiting.  Genitourinary: Negative for dysuria, flank pain and hematuria.  Musculoskeletal: Positive for arthralgias and back pain. Negative for gait problem, joint swelling, myalgias, neck pain and neck stiffness.  Skin: Negative for color change and rash.  Neurological: Negative for seizures and syncope.  All other systems reviewed and are negative.    Physical Exam Updated Vital Signs BP (!) 146/98   Pulse 74   Temp 97.8 F (36.6 C) (Oral)   Resp 18   Ht 5\' 11"  (1.803 m)   Wt 88.9 kg (196 lb)   SpO2 95%   BMI 27.34 kg/m   Physical Exam  Constitutional: He appears well-developed and well-nourished.  HENT:    Head: Normocephalic and atraumatic.  Eyes: Conjunctivae are normal.  Neck: Neck supple.  Cardiovascular: Normal rate and regular rhythm.   No murmur heard. Pulmonary/Chest: Effort normal and breath sounds normal. No respiratory distress.  Abdominal: Soft. There is no tenderness.  Musculoskeletal: He exhibits no edema.       Right shoulder: He exhibits tenderness, pain and spasm. He exhibits no bony tenderness, no swelling, no effusion, no deformity and normal strength.       Left shoulder: He exhibits tenderness, pain and spasm. He exhibits no bony tenderness, no swelling, no effusion, no deformity and normal strength.       Right elbow: Normal.      Left elbow: Normal.       Right knee: He exhibits normal range of motion, no swelling and no effusion. No tenderness found.       Left knee: He exhibits normal range of motion, no swelling and no effusion. No tenderness found.       Cervical back: He exhibits spasm. He exhibits normal range of motion, no tenderness, no bony tenderness, no deformity and no pain.       Right upper arm: Normal.       Left upper arm: Normal.  Neurological: He is alert. He has normal strength. He displays no tremor. No cranial nerve deficit or sensory deficit. He exhibits normal muscle tone. Coordination and gait normal.  Skin: Skin is warm and dry.  Psychiatric: He has a normal mood and affect.  Nursing note and vitals reviewed.    ED Treatments / Results  Labs (all labs ordered are listed, but only abnormal results are displayed) Labs Reviewed  BASIC METABOLIC PANEL - Abnormal; Notable for the following:       Result Value   Sodium 133 (*)    Glucose, Bld 353 (*)    BUN 22 (*)    Creatinine, Ser 1.58 (*)    Calcium 10.6 (*)    GFR calc non Af Amer 41 (*)    GFR calc Af Amer 47 (*)    All other components within normal limits  URINALYSIS, ROUTINE W REFLEX MICROSCOPIC - Abnormal; Notable for the following:    APPearance CLOUDY (*)    Glucose, UA  >=500 (*)    Hgb urine dipstick MODERATE (*)    Protein, ur 100 (*)    Leukocytes, UA LARGE (*)    Bacteria, UA RARE (*)    All other components within normal limits  CBG MONITORING, ED - Abnormal; Notable for the following:    Glucose-Capillary 363 (*)    All other components within normal limits  CBG MONITORING, ED - Abnormal; Notable for the following:    Glucose-Capillary 329 (*)    All other components within normal limits    EKG  EKG Interpretation None       Radiology No  results found.  Procedures Procedures (including critical care time)  Medications Ordered in ED Medications  lactated ringers bolus 1,000 mL (not administered)  acetaminophen (TYLENOL) tablet 1,000 mg (1,000 mg Oral Given 04/17/17 2039)  baclofen (LIORESAL) tablet 5 mg (5 mg Oral Given 04/17/17 2039)     Initial Impression / Assessment and Plan / ED Course  I have reviewed the triage vital signs and the nursing notes.  Pertinent labs & imaging results that were available during my care of the patient were reviewed by me and considered in my medical decision making (see chart for details).     This is a 76 year old male with history of T2 DM, HTN with other above medical problems who presents with ongoing musculoskeletal pain in the bilateral shoulders and knees.  No personal history or family history of RA. No recent travel out of the country.  Initial blood glucose measured in triage and 363.  Patient given Tylenol and baclofen for his shoulder pain.  No indication for x-rays at this time given bilateral nature, multiple locations and chronic nature suggestive of osteoarthritis, musculoskeletal wear and tear.  On repeat blood glucose nearly 2 hours after presentation, found to be 329.  Patient stated he did not eat dinner.  Laboratory studies drawn.  No evidence of ketoacidosis at this time given negative ion gap, no ketones in the urine, creatinine compared to prior is stable. Patient refused  IV fluids. I had a detailed discussion at bedside (25 minutes) about follow up, need for IV fluids due to his hyperglycemia, and risk for DKA since he is not currently on diabetic medications. He stated he wanted to be admitted but refused IV fluids. I told him he did not meet indication for admission but IV fluids would help his hyperglycemia until follow up with his PCP this week. He continued to refuse. We finally agreed discharge was the best option with close outpatient follow up this week being stressed. I also advised patient to refrain from ibuprofen use given his chronic kidney disease.  Final Clinical Impressions(s) / ED Diagnoses   Final diagnoses:  Chronic pain of both shoulders  Hyperglycemia    New Prescriptions New Prescriptions   No medications on file     Shaune Pollack, MD 04/17/17 2213    Eber Hong, MD 04/18/17 [phone]

## 2017-04-17 NOTE — ED Notes (Signed)
Pt stating he needs to stay the night because he has "burning urination" and because he "sometimes is dizzy"

## 2017-04-17 NOTE — ED Notes (Signed)
Pt refusing IV until son comes back in room

## 2017-04-17 NOTE — ED Notes (Signed)
ED Provider at bedside. 

## 2017-04-17 NOTE — ED Notes (Signed)
Offered to help pt get a ride home, pt reports he is going to wait in the waiting room because he does not want to go home right now.

## 2017-04-17 NOTE — ED Notes (Signed)
Pt reports burning with urination

## 2017-04-17 NOTE — ED Triage Notes (Signed)
Pt in c/o bil shoulder & bil knee pain, pt reports generalized fatigue, reports taking DM meds and checking sugar at home, pt reports being admitted in April for glucose issues, pt has large umbilical hernia,m pt ambulatory, A&O x4

## 2017-04-23 ENCOUNTER — Encounter (HOSPITAL_COMMUNITY): Payer: Self-pay | Admitting: Emergency Medicine

## 2017-04-23 ENCOUNTER — Emergency Department (HOSPITAL_COMMUNITY)
Admission: EM | Admit: 2017-04-23 | Discharge: 2017-04-23 | Disposition: A | Discharge status: Home / Self Care | Payer: Medicare Other | Attending: Emergency Medicine | Admitting: Emergency Medicine

## 2017-04-23 DIAGNOSIS — E119 Type 2 diabetes mellitus without complications: Secondary | ICD-10-CM | POA: Insufficient documentation

## 2017-04-23 DIAGNOSIS — I10 Essential (primary) hypertension: Secondary | ICD-10-CM | POA: Insufficient documentation

## 2017-04-23 DIAGNOSIS — R310 Gross hematuria: Secondary | ICD-10-CM | POA: Diagnosis not present

## 2017-04-23 DIAGNOSIS — Z79899 Other long term (current) drug therapy: Secondary | ICD-10-CM | POA: Diagnosis not present

## 2017-04-23 DIAGNOSIS — R300 Dysuria: Secondary | ICD-10-CM | POA: Insufficient documentation

## 2017-04-23 LAB — CBG MONITORING, ED: GLUCOSE-CAPILLARY: 195 mg/dL — AB (ref 65–99)

## 2017-04-23 LAB — CBC
HCT: 42.3 % (ref 39.0–52.0)
Hemoglobin: 14.6 g/dL (ref 13.0–17.0)
MCH: 28.9 pg (ref 26.0–34.0)
MCHC: 34.5 g/dL (ref 30.0–36.0)
MCV: 83.8 fL (ref 78.0–100.0)
PLATELETS: 215 10*3/uL (ref 150–400)
RBC: 5.05 MIL/uL (ref 4.22–5.81)
RDW: 13.2 % (ref 11.5–15.5)
WBC: 7.5 10*3/uL (ref 4.0–10.5)

## 2017-04-23 LAB — BASIC METABOLIC PANEL
Anion gap: 9 (ref 5–15)
BUN: 21 mg/dL — ABNORMAL HIGH (ref 6–20)
CALCIUM: 10.6 mg/dL — AB (ref 8.9–10.3)
CO2: 19 mmol/L — ABNORMAL LOW (ref 22–32)
Chloride: 103 mmol/L (ref 101–111)
Creatinine, Ser: 1.29 mg/dL — ABNORMAL HIGH (ref 0.61–1.24)
GFR calc non Af Amer: 52 mL/min — ABNORMAL LOW (ref >60)
Glucose, Bld: 211 mg/dL — ABNORMAL HIGH (ref 65–99)
Potassium: 4.1 mmol/L (ref 3.5–5.1)
SODIUM: 131 mmol/L — AB (ref 135–145)

## 2017-04-23 LAB — URINALYSIS, MICROSCOPIC (REFLEX)
BACTERIA UA: NONE SEEN
SQUAMOUS EPITHELIAL / LPF: NONE SEEN

## 2017-04-23 LAB — URINALYSIS, ROUTINE W REFLEX MICROSCOPIC
BILIRUBIN URINE: NEGATIVE
KETONES UR: NEGATIVE mg/dL
NITRITE: NEGATIVE
PH: 7 (ref 5.0–8.0)
PROTEIN: 100 mg/dL — AB
Specific Gravity, Urine: 1.017 (ref 1.005–1.030)

## 2017-04-23 MED ORDER — BACLOFEN 5 MG HALF TABLET
5.0000 mg | ORAL_TABLET | Freq: Once | ORAL | Status: AC
  Administered 2017-04-23: 5 mg via ORAL
  Filled 2017-04-23: qty 1

## 2017-04-23 MED ORDER — BACLOFEN 5 MG HALF TABLET: 5.0000 mg | ORAL_TABLET | Freq: Three times a day (TID) | ORAL | Status: DC

## 2017-04-23 MED ORDER — ACETAMINOPHEN 325 MG PO TABS
650.0000 mg | ORAL_TABLET | Freq: Once | ORAL | Status: AC
  Administered 2017-04-23: 650 mg via ORAL
  Filled 2017-04-23: qty 2

## 2017-04-23 MED ORDER — IOPAMIDOL (ISOVUE-300) INJECTION 61%
INTRAVENOUS | Status: AC
  Filled 2017-04-23: qty 100

## 2017-04-23 NOTE — ED Triage Notes (Signed)
Pt states recently taken off a diuretic? While on the 6th floor. Pt states painful urination for several days, with red urine. Denies shortness of breath, denies swelling to arms legs and feet, denies swelling to abdomen.

## 2017-04-23 NOTE — ED Provider Notes (Signed)
Medical screening examination/treatment/procedure(s) were conducted as a shared visit with non-physician practitioner(s) and myself.  I personally evaluated the patient during the encounter.  76 year old male with unclear complaints. Patient states that he has hematuria however this seems to have improved. On my evaluation the patient is complaining about needing a dose of baclofen. States he has back pain and this is likely that helps. On exam he has mild tenderness to palpation bilateral CVA area. Low suspicion for medical emergency. Patient refusing workup otherwise.     Neesha Langton, Barbara CowerJason, MD 04/26/17 [phone]

## 2017-04-23 NOTE — ED Notes (Signed)
Pt refused to get into gown. 

## 2017-04-23 NOTE — ED Provider Notes (Signed)
[facility]PT Provider Note   CSN: [id number] Arrival date & time: 04/23/17  1750     History   Chief Complaint Chief Complaint  Patient presents with  . Dysuria    HPI ERHARD SENSKE is a 76 y.o. male with history of hypertension, hyperlipidemia, diabetes who presents with a several day history of mild dysuria and hematuria. Patient reports he has been on Lovenox in the past. He resumed taking it. Patient is a poor historian. Patient was evaluated one week ago for chronic shoulder pain. Per review of medical records, patient had been taking ibuprofen daily for this, however he has changed to Tylenol. He denies fever, chest pain, shortness of breath, abdominal pain, nausea, vomiting.  HPI  Past Medical History:  Diagnosis Date  . Diabetes mellitus without complication (HCC)   . GERD (gastroesophageal reflux disease)   . Hyperlipidemia   . Hypertension   . Initial insomnia   . Umbilical hernia     Patient Active Problem List   Diagnosis Date Noted  . DM2 (diabetes mellitus, type 2) (HCC) 12/02/2016  . Sepsis secondary to UTI (HCC) 12/02/2016  . Pyohydronephrosis 12/02/2016  . Bladder mass 12/02/2016  . CKD (chronic kidney disease) stage 3, GFR 30-59 ml/min 12/02/2016  . Recurrent umbilical hernia 10/17/2012  . S/P umbilical hernia repair, follow-up exam 11/30/2011  . Umbilical hernia 10/12/2011  . Right inguinal hernia 10/12/2011    Past Surgical History:  Procedure Laterality Date  . HERNIA REPAIR     2  . UMBILICAL HERNIA REPAIR  11/22/2011   Procedure: HERNIA REPAIR UMBILICAL ADULT;  Surgeon: Liz Malady, MD;  Location: [facility] OR;  Service: General;  Laterality: N/A;       Home Medications    Prior to Admission medications   Medication Sig Start Date End Date Taking? Authorizing Provider  amLODipine (NORVASC) 5 MG tablet Take 1 tablet (5 mg total) by mouth daily. 01/17/17  Yes Cheron Schaumann K, PA-C  finasteride (PROSCAR) 5 MG tablet Take 1 tablet (5  mg total) by mouth daily. 01/17/17  Yes Elson Areas, PA-C  tamsulosin (FLOMAX) 0.4 MG CAPS capsule Take 1 capsule (0.4 mg total) by mouth daily after supper. 01/17/17  Yes Cheron Schaumann K, PA-C  ibuprofen (ADVIL,MOTRIN) 200 MG tablet Take 400 mg by mouth every 6 (six) hours as needed for headache or moderate pain.    [provider]    Family History No family history on file.  Social History Social History  Substance Use Topics  . Smoking status: Never Smoker  . Smokeless tobacco: Never Used  . Alcohol use No     Allergies   Pork-derived products   Review of Systems Review of Systems  Constitutional: Negative for chills and fever.  HENT: Negative for facial swelling and sore throat.   Respiratory: Negative for shortness of breath.   Cardiovascular: Negative for chest pain.  Gastrointestinal: Negative for abdominal pain, nausea and vomiting.  Genitourinary: Positive for dysuria and hematuria.  Musculoskeletal: Negative for back pain.  Skin: Negative for rash and wound.  Neurological: Negative for headaches.  Psychiatric/Behavioral: The patient is not nervous/anxious.      Physical Exam Updated Vital Signs BP (!) 138/93 (BP Location: Left Arm)   Pulse 74   Temp 98.3 F (36.8 C)   Resp 16   SpO2 100%   Physical Exam  Constitutional: He appears well-developed and well-nourished. No distress.  HENT:  Head: Normocephalic and atraumatic.  Mouth/Throat: Oropharynx is clear  and moist. No oropharyngeal exudate.  Eyes: Pupils are equal, round, and reactive to light. Conjunctivae are normal. Right eye exhibits no discharge. Left eye exhibits no discharge. No scleral icterus.  Neck: Normal range of motion. Neck supple. No thyromegaly present.  Cardiovascular: Normal rate, regular rhythm, normal heart sounds and intact distal pulses.  Exam reveals no gallop and no friction rub.   No murmur heard. Pulmonary/Chest: Effort normal and breath sounds normal. No stridor. No  respiratory distress. He has no wheezes. He has no rales.  Abdominal: Soft. Bowel sounds are normal. He exhibits no distension. There is no tenderness. There is CVA tenderness. There is no rebound and no guarding.  Musculoskeletal: He exhibits no edema.  Lymphadenopathy:    He has no cervical adenopathy.  Neurological: He is alert. Coordination normal.  Skin: Skin is warm and dry. No rash noted. He is not diaphoretic. No pallor.  Psychiatric: He has a normal mood and affect.  Nursing note and vitals reviewed.    ED Treatments / Results  Labs (all labs ordered are listed, but only abnormal results are displayed) Labs Reviewed  URINALYSIS, ROUTINE W REFLEX MICROSCOPIC - Abnormal; Notable for the following:       Result Value   Color, Urine RED (*)    APPearance CLOUDY (*)    Glucose, UA >=500 (*)    Hgb urine dipstick LARGE (*)    Protein, ur 100 (*)    Leukocytes, UA LARGE (*)    All other components within normal limits  BASIC METABOLIC PANEL - Abnormal; Notable for the following:    Sodium 131 (*)    CO2 19 (*)    Glucose, Bld 211 (*)    BUN 21 (*)    Creatinine, Ser 1.29 (*)    Calcium 10.6 (*)    GFR calc non Af Amer 52 (*)    All other components within normal limits  CBG MONITORING, ED - Abnormal; Notable for the following:    Glucose-Capillary 195 (*)    All other components within normal limits  CBC  URINALYSIS, MICROSCOPIC (REFLEX)    EKG  EKG Interpretation None       Radiology No results found.  Procedures Procedures (including critical care time)  Medications Ordered in ED Medications  acetaminophen (TYLENOL) tablet 650 mg (650 mg Oral Given 04/23/17 2233)  baclofen (LIORESAL) tablet 5 mg (5 mg Oral Given 04/23/17 2233)     Initial Impression / Assessment and Plan / ED Course  I have reviewed the triage vital signs and the nursing notes.  Pertinent labs & imaging results that were available during my care of the patient were reviewed by me and  considered in my medical decision making (see chart for details).     Patient with hematuria and mild dysuria. Creatinine within normal limits for patient. CBC unremarkable. Patient reports his hematuria and symptoms have improved while in the ED and requested baclofen, which she received last week when he was here. Stated it made him him feel better (regarding his chronic shoulder pain). We had recommended CT abdomen pelvis, however he declines. One dose of baclofen 5 mg given to patient. We'll discharge with follow-up to urology and PCP. Return precautions discussed. Patient understands and agrees with plan. Patient vitals stable throughout ED course and discharged in satisfactory condition. Patient also evaluated by Dr. Clayborne Dana who guided the patient's management and agrees with plan.  Final Clinical Impressions(s) / ED Diagnoses   Final diagnoses:  Gross hematuria  New Prescriptions Discharge Medication List as of 04/23/2017 10:02 PM       Emi HolesLaw, Ronnesha Mester M, PA-C 04/24/17 0041    Mesner, Barbara CowerJason, MD 04/26/17 [phone]

## 2017-04-23 NOTE — Discharge Instructions (Signed)
Please follow-up with the urologist, Dr. Retta Dionesahlstedt, as well as her primary care provider for further evaluation and treatment of your symptoms. Please return to the emergency department if he develop any new or worsening symptoms. You can take Tylenol as prescribed over-the-counter, as needed for your pain.

## 2017-08-05 ENCOUNTER — Other Ambulatory Visit: Payer: Self-pay

## 2017-08-05 ENCOUNTER — Emergency Department (HOSPITAL_COMMUNITY): Payer: Medicare Other

## 2017-08-05 ENCOUNTER — Encounter (HOSPITAL_COMMUNITY): Payer: Self-pay

## 2017-08-05 ENCOUNTER — Emergency Department (HOSPITAL_COMMUNITY)
Admission: EM | Admit: 2017-08-05 | Discharge: 2017-08-05 | Disposition: A | Discharge status: Home / Self Care | Payer: Medicare Other | Attending: Emergency Medicine | Admitting: Emergency Medicine

## 2017-08-05 DIAGNOSIS — N3001 Acute cystitis with hematuria: Secondary | ICD-10-CM | POA: Diagnosis not present

## 2017-08-05 DIAGNOSIS — R202 Paresthesia of skin: Secondary | ICD-10-CM | POA: Diagnosis not present

## 2017-08-05 DIAGNOSIS — Z79899 Other long term (current) drug therapy: Secondary | ICD-10-CM | POA: Insufficient documentation

## 2017-08-05 DIAGNOSIS — N183 Chronic kidney disease, stage 3 (moderate): Secondary | ICD-10-CM | POA: Diagnosis not present

## 2017-08-05 DIAGNOSIS — I129 Hypertensive chronic kidney disease with stage 1 through stage 4 chronic kidney disease, or unspecified chronic kidney disease: Secondary | ICD-10-CM | POA: Diagnosis not present

## 2017-08-05 DIAGNOSIS — R42 Dizziness and giddiness: Secondary | ICD-10-CM | POA: Diagnosis present

## 2017-08-05 DIAGNOSIS — E1122 Type 2 diabetes mellitus with diabetic chronic kidney disease: Secondary | ICD-10-CM | POA: Diagnosis not present

## 2017-08-05 LAB — COMPREHENSIVE METABOLIC PANEL
ALT: 39 U/L (ref 17–63)
AST: 49 U/L — ABNORMAL HIGH (ref 15–41)
Albumin: 3.7 g/dL (ref 3.5–5.0)
Alkaline Phosphatase: 141 U/L — ABNORMAL HIGH (ref 38–126)
Anion gap: 6 (ref 5–15)
BILIRUBIN TOTAL: 1 mg/dL (ref 0.3–1.2)
BUN: 15 mg/dL (ref 6–20)
CHLORIDE: 100 mmol/L — AB (ref 101–111)
CO2: 24 mmol/L (ref 22–32)
CREATININE: 1.34 mg/dL — AB (ref 0.61–1.24)
Calcium: 10.2 mg/dL (ref 8.9–10.3)
GFR, EST AFRICAN AMERICAN: 57 mL/min — AB (ref >60)
GFR, EST NON AFRICAN AMERICAN: 49 mL/min — AB (ref >60)
Glucose, Bld: 305 mg/dL — ABNORMAL HIGH (ref 65–99)
POTASSIUM: 4.7 mmol/L (ref 3.5–5.1)
Sodium: 130 mmol/L — ABNORMAL LOW (ref 135–145)
TOTAL PROTEIN: 7.2 g/dL (ref 6.5–8.1)

## 2017-08-05 LAB — URINALYSIS, ROUTINE W REFLEX MICROSCOPIC
BILIRUBIN URINE: NEGATIVE
Bacteria, UA: NONE SEEN
Glucose, UA: >=500 mg/dL — AB
Ketones, ur: NEGATIVE mg/dL
Nitrite: NEGATIVE
PH: 6 (ref 5.0–8.0)
Protein, ur: 100 mg/dL — AB
SPECIFIC GRAVITY, URINE: 1.014 (ref 1.005–1.030)
Squamous Epithelial / LPF: NONE SEEN

## 2017-08-05 LAB — I-STAT TROPONIN, ED: TROPONIN I, POC: 0 ng/mL (ref 0.00–0.08)

## 2017-08-05 LAB — CBC
HEMATOCRIT: 42.5 % (ref 39.0–52.0)
Hemoglobin: 15 g/dL (ref 13.0–17.0)
MCH: 29.4 pg (ref 26.0–34.0)
MCHC: 35.3 g/dL (ref 30.0–36.0)
MCV: 83.2 fL (ref 78.0–100.0)
Platelets: 193 10*3/uL (ref 150–400)
RBC: 5.11 MIL/uL (ref 4.22–5.81)
RDW: 13.3 % (ref 11.5–15.5)
WBC: 7.2 10*3/uL (ref 4.0–10.5)

## 2017-08-05 LAB — PROTIME-INR
INR: 1.03
Prothrombin Time: 13.4 seconds (ref 11.4–15.2)

## 2017-08-05 LAB — DIFFERENTIAL
BASOS PCT: 0 %
Basophils Absolute: 0 10*3/uL (ref 0.0–0.1)
EOS ABS: 0.2 10*3/uL (ref 0.0–0.7)
Eosinophils Relative: 3 %
Lymphocytes Relative: 25 %
Lymphs Abs: 1.8 10*3/uL (ref 0.7–4.0)
MONO ABS: 0.6 10*3/uL (ref 0.1–1.0)
MONOS PCT: 8 %
Neutro Abs: 4.5 10*3/uL (ref 1.7–7.7)
Neutrophils Relative %: 64 %

## 2017-08-05 LAB — I-STAT CHEM 8, ED
BUN: 17 mg/dL (ref 6–20)
Calcium, Ion: 1.36 mmol/L (ref 1.15–1.40)
Chloride: 100 mmol/L — ABNORMAL LOW (ref 101–111)
Creatinine, Ser: 1.2 mg/dL (ref 0.61–1.24)
GLUCOSE: 316 mg/dL — AB (ref 65–99)
HEMATOCRIT: 42 % (ref 39.0–52.0)
HEMOGLOBIN: 14.3 g/dL (ref 13.0–17.0)
POTASSIUM: 4.2 mmol/L (ref 3.5–5.1)
Sodium: 134 mmol/L — ABNORMAL LOW (ref 135–145)
TCO2: 24 mmol/L (ref 22–32)

## 2017-08-05 LAB — APTT: APTT: 29 s (ref 24–36)

## 2017-08-05 MED ORDER — ACETAMINOPHEN 325 MG PO TABS
650.0000 mg | ORAL_TABLET | Freq: Once | ORAL | Status: AC
  Administered 2017-08-05: 650 mg via ORAL
  Filled 2017-08-05: qty 2

## 2017-08-05 MED ORDER — SODIUM CHLORIDE 0.9 % IV BOLUS (SEPSIS)
1000.0000 mL | Freq: Once | INTRAVENOUS | Status: AC
  Administered 2017-08-05: 1000 mL via INTRAVENOUS

## 2017-08-05 MED ORDER — CEPHALEXIN 250 MG PO CAPS
500.0000 mg | ORAL_CAPSULE | Freq: Once | ORAL | Status: AC
  Administered 2017-08-05: 500 mg via ORAL
  Filled 2017-08-05: qty 2

## 2017-08-05 MED ORDER — CEPHALEXIN 500 MG PO CAPS: 500.0000 mg | ORAL_CAPSULE | Freq: Two times a day (BID) | ORAL | 0 refills | Status: AC

## 2017-08-05 NOTE — ED Notes (Signed)
IV team at bedside 

## 2017-08-05 NOTE — ED Notes (Signed)
Several attempts made to collect lab work. IV team consult placed.

## 2017-08-05 NOTE — ED Notes (Addendum)
Pt ambulated up and down hallway at 18:53 on cardiac monitor. Gait was steady and normal, no complaints of any weakness or pain. No change in O2 nor heart rate.

## 2017-08-05 NOTE — ED Notes (Addendum)
IV team RN unsuccessful at this time. Sending another RN from IV team to look with US. EDP made aware.

## 2017-08-05 NOTE — Discharge Instructions (Signed)
Please take the antibiotics to treat the urinary tract infection.  Please stay hydrated.  Please follow-up with your primary care physician for further reassessment and management.  If any symptoms change or worsen, please return to the nearest emergency department.

## 2017-08-05 NOTE — ED Provider Notes (Signed)
[name] Provider Note   CSN: [id number] Arrival date & time: 08/05/17  1131     History   Chief Complaint Chief Complaint  Patient presents with  . Numbness  . Dizziness    HPI Daniel Porter is a 76 y.o. male.  Pt presents to the ED today with dizziness and numbness.  Pt is from [place] with a heavy accent and is difficult to understand.  His grandson is at bedside to assist with translation.  The pt said he has seen white in his urine.  He is worried about this.  According to chart, he has a hx of frequent UTIs and became septic from one in April requiring a PICC line.  The pt said he felt dizzy when he stood up yesterday.  He does not feel dizzy now.      Past Medical History:  Diagnosis Date  . Diabetes mellitus without complication (HCC)   . GERD (gastroesophageal reflux disease)   . Hyperlipidemia   . Hypertension   . Initial insomnia   . Umbilical hernia     Patient Active Problem List   Diagnosis Date Noted  . DM2 (diabetes mellitus, type 2) (HCC) 12/02/2016  . Sepsis secondary to UTI (HCC) 12/02/2016  . Pyohydronephrosis 12/02/2016  . Bladder mass 12/02/2016  . CKD (chronic kidney disease) stage 3, GFR 30-59 ml/min (HCC) 12/02/2016  . Recurrent umbilical hernia 10/17/2012  . S/P umbilical hernia repair, follow-up exam 11/30/2011  . Umbilical hernia 10/12/2011  . Right inguinal hernia 10/12/2011    Past Surgical History:  Procedure Laterality Date  . HERNIA REPAIR     2  . UMBILICAL HERNIA REPAIR  11/22/2011   Procedure: HERNIA REPAIR UMBILICAL ADULT;  Surgeon: Liz MaladyBurke E Thompson, MD;  [facility];  Service: General;  Laterality: N/A;       Home Medications    Prior to Admission medications   Medication Sig Start Date End Date Taking? Authorizing Provider  amLODipine (NORVASC) 5 MG tablet Take 1 tablet (5 mg total) by mouth daily. 01/17/17   Elson AreasSofia, Leslie K, PA-C  finasteride (PROSCAR) 5 MG tablet Take  1 tablet (5 mg total) by mouth daily. 01/17/17   Elson AreasSofia, Leslie K, PA-C  ibuprofen (ADVIL,MOTRIN) 200 MG tablet Take 400 mg by mouth every 6 (six) hours as needed for headache or moderate pain.    [provider]  tamsulosin (FLOMAX) 0.4 MG CAPS capsule Take 1 capsule (0.4 mg total) by mouth daily after supper. 01/17/17   Elson AreasSofia, Leslie K, PA-C    Family History History reviewed. No pertinent family history.  Social History Social History   Tobacco Use  . Smoking status: Never Smoker  . Smokeless tobacco: Never Used  Substance Use Topics  . Alcohol use: No  . Drug use: No     Allergies   Pork-derived products   Review of Systems Review of Systems  Genitourinary:       White urine  All other systems reviewed and are negative.    Physical Exam Updated Vital Signs BP 131/76   Pulse 84   Temp 97.6 F (36.4 C) (Oral)   Resp 18   SpO2 98%   Physical Exam  Constitutional: He is oriented to person, place, and time. He appears well-developed and well-nourished.  HENT:  Head: Normocephalic and atraumatic.  Right Ear: External ear normal.  Left Ear: External ear normal.  Nose: Nose normal.  Mouth/Throat: Oropharynx is clear and moist.  Eyes: Conjunctivae and EOM are normal. Pupils are equal, round, and reactive to light.  Neck: Normal range of motion. Neck supple.  Cardiovascular: Normal rate, regular rhythm, normal heart sounds and intact distal pulses.  Pulmonary/Chest: Effort normal and breath sounds normal.  Abdominal: Soft. Bowel sounds are normal.  Musculoskeletal: Normal range of motion.  Neurological: He is alert and oriented to person, place, and time.  Skin: Skin is warm. Capillary refill takes less than 2 seconds.  Psychiatric: He has a normal mood and affect. His behavior is normal. Judgment and thought content normal.  Nursing note and vitals reviewed.    ED Treatments / Results  Labs (all labs ordered are listed, but only abnormal results are  displayed) Labs Reviewed  URINE CULTURE  PROTIME-INR  APTT  CBC  DIFFERENTIAL  COMPREHENSIVE METABOLIC PANEL  URINALYSIS, ROUTINE W REFLEX MICROSCOPIC  I-STAT TROPONIN, ED  CBG MONITORING, ED  I-STAT CHEM 8, ED    EKG  EKG Interpretation  Date/Time:  Saturday August 05 2017 12:16:04 EST Ventricular Rate:  74 PR Interval:  152 QRS Duration: 84 QT Interval:  370 QTC Calculation: 410 R Axis:   8 Text Interpretation:  Normal sinus rhythm Cannot rule out Inferior infarct , age undetermined Abnormal ECG Confirmed by Jacalyn LefevreHaviland, Felicidad Sugarman [phone]([id number]) on 08/05/2017 1:29:01 PM       Radiology Ct Head Wo Contrast  Result Date: 08/05/2017 CLINICAL DATA:  Bilateral lower extremity numbness.  Dizziness. EXAM: CT HEAD WITHOUT CONTRAST TECHNIQUE: Contiguous axial images were obtained from the base of the skull through the vertex without intravenous contrast. COMPARISON:  None. FINDINGS: Brain: There is some cortical atrophy. No evidence of acute abnormality including hemorrhage, infarct, mass lesion, mass effect, midline shift or abnormal extra-axial fluid collection. No hydrocephalus or pneumocephalus. Vascular: No hyperdense vessel or unexpected calcification. Skull: Intact. Sinuses/Orbits: Mucous retention cyst or polyp right maxillary sinus noted. Other: None. IMPRESSION: No acute abnormality. Mild cortical atrophy. Mucous retention cyst or polyp right maxillary sinus. Electronically Signed   By: Drusilla Kannerhomas  Dalessio M.D.   On: 08/05/2017 12:44    Procedures Procedures (including critical care time)  Medications Ordered in ED Medications  sodium chloride 0.9 % bolus 1,000 mL (0 mLs Intravenous Hold 08/05/17 1505)     Initial Impression / Assessment and Plan / ED Course  I have reviewed the triage vital signs and the nursing notes.  Pertinent labs & imaging results that were available during my care of the patient were reviewed by me and considered in my medical decision making (see chart for  details).   Labs and urine pending at shift change as pt has been a difficult stick.  Pt signed out to Dr. Rush Landmarkegeler pending results.  Anticipate d/c.   Final Clinical Impressions(s) / ED Diagnoses   Final diagnoses:  Dizziness    ED Discharge Orders    None       Jacalyn LefevreHaviland, Brenden Rudman, MD 08/05/17 [phone]

## 2017-08-05 NOTE — ED Provider Notes (Signed)
7:03 PM Care was assumed from Dr. Particia NearingHaviland while awaiting diagnostic laboratory testing and imaging.  Patient was brought in for pyuria and fatigue with lightheadedness.  Patient reports his lightheadedness and fatigue have resolved.  Patient was awaiting laboratory testing.  Urinalysis showed evidence of hematuria, proteinuria, large leukocytes, and white blood cells.  Although there is no bacteria, there is clinically a concern for urinary tract infection with the purulence.  Patient is laboratory testing otherwise was overall reassuring aside from hyperglycemia.  Kidney function similar to prior.  Patient was reassessed and was feeling better.  Given patient's reassuring CT head and lab testing and no evidence of orthostatic hypotension, patient was felt stable for discharge home.  Patient is coming by family who agreed with plan for discharge home.  Patient will be given 1 dose of antibiotic in the emergency department for his urinary tract infection and culture will be sent.  Patient will be given prescription for Keflex and follow-up with his PCP.    Patient and family understood return precautions for any signs or symptoms of worsening infection.  Patient had no other worsens or concerns and was discharged in good condition.    Clinical Impression: 1. Dizziness     Disposition: Discharge  Condition: Good  I have discussed the results, Dx and Tx plan with the pt(& family if present). He/she/they expressed understanding and agree(s) with the plan. Discharge instructions discussed at great length. Strict return precautions discussed and pt &/or family have verbalized understanding of the instructions. No further questions at time of discharge.    This SmartLink is deprecated. Use AVSMEDLIST instead to display the medication list for a patient.  Follow Up: No follow-up provider specified.    Meriam Chojnowski, Canary Brimhristopher J, MD 08/05/17 2031

## 2017-08-05 NOTE — ED Notes (Signed)
Attempted PIV x 1; no success. Second RN to attempt.  

## 2017-08-05 NOTE — ED Notes (Signed)
ED Provider at bedside. 

## 2017-08-05 NOTE — ED Notes (Signed)
This RN attempted for IV x 1 without success.  Phlebotomy at bedside to attempt for blood draw.

## 2017-08-05 NOTE — ED Triage Notes (Signed)
Pt states he has numbness in his legs bilaterally. Pt also states that he has had some dizziness. Pt is alert and oriented and ambulatory.

## 2017-08-05 NOTE — ED Notes (Signed)
Son of Pt stated that his father's IV medication therapy completed.

## 2017-08-05 NOTE — ED Notes (Signed)
Pt called to state he now has a headache.

## 2017-08-06 LAB — URINE CULTURE

## 2017-08-09 ENCOUNTER — Other Ambulatory Visit: Payer: Self-pay

## 2017-08-09 ENCOUNTER — Encounter (HOSPITAL_COMMUNITY): Payer: Self-pay | Admitting: Emergency Medicine

## 2017-08-09 ENCOUNTER — Emergency Department (HOSPITAL_COMMUNITY)
Admission: EM | Admit: 2017-08-09 | Discharge: 2017-08-09 | Disposition: A | Discharge status: Home / Self Care | Payer: Medicare Other | Attending: Physician Assistant | Admitting: Physician Assistant

## 2017-08-09 DIAGNOSIS — Z79899 Other long term (current) drug therapy: Secondary | ICD-10-CM | POA: Diagnosis not present

## 2017-08-09 DIAGNOSIS — N390 Urinary tract infection, site not specified: Secondary | ICD-10-CM | POA: Diagnosis not present

## 2017-08-09 DIAGNOSIS — E1122 Type 2 diabetes mellitus with diabetic chronic kidney disease: Secondary | ICD-10-CM | POA: Insufficient documentation

## 2017-08-09 DIAGNOSIS — I129 Hypertensive chronic kidney disease with stage 1 through stage 4 chronic kidney disease, or unspecified chronic kidney disease: Secondary | ICD-10-CM | POA: Diagnosis not present

## 2017-08-09 DIAGNOSIS — N183 Chronic kidney disease, stage 3 (moderate): Secondary | ICD-10-CM | POA: Diagnosis not present

## 2017-08-09 DIAGNOSIS — R300 Dysuria: Secondary | ICD-10-CM | POA: Diagnosis present

## 2017-08-09 LAB — URINALYSIS, ROUTINE W REFLEX MICROSCOPIC
BILIRUBIN URINE: NEGATIVE
Ketones, ur: NEGATIVE mg/dL
NITRITE: NEGATIVE
PROTEIN: 30 mg/dL — AB
Specific Gravity, Urine: 1.014 (ref 1.005–1.030)
pH: 6 (ref 5.0–8.0)

## 2017-08-09 LAB — BASIC METABOLIC PANEL
Anion gap: 9 (ref 5–15)
BUN: 20 mg/dL (ref 6–20)
CHLORIDE: 100 mmol/L — AB (ref 101–111)
CO2: 22 mmol/L (ref 22–32)
Calcium: 10.5 mg/dL — ABNORMAL HIGH (ref 8.9–10.3)
Creatinine, Ser: 1.5 mg/dL — ABNORMAL HIGH (ref 0.61–1.24)
GFR calc Af Amer: 50 mL/min — ABNORMAL LOW (ref >60)
GFR calc non Af Amer: 43 mL/min — ABNORMAL LOW (ref >60)
GLUCOSE: 383 mg/dL — AB (ref 65–99)
POTASSIUM: 4.3 mmol/L (ref 3.5–5.1)
Sodium: 131 mmol/L — ABNORMAL LOW (ref 135–145)

## 2017-08-09 LAB — CBC
HEMATOCRIT: 41.4 % (ref 39.0–52.0)
Hemoglobin: 14.7 g/dL (ref 13.0–17.0)
MCH: 29.5 pg (ref 26.0–34.0)
MCHC: 35.5 g/dL (ref 30.0–36.0)
MCV: 83.1 fL (ref 78.0–100.0)
Platelets: 197 10*3/uL (ref 150–400)
RBC: 4.98 MIL/uL (ref 4.22–5.81)
RDW: 13.4 % (ref 11.5–15.5)
WBC: 7.8 10*3/uL (ref 4.0–10.5)

## 2017-08-09 MED ORDER — NITROFURANTOIN MONOHYD MACRO 100 MG PO CAPS: 100.0000 mg | ORAL_CAPSULE | Freq: Two times a day (BID) | ORAL | 0 refills | Status: DC

## 2017-08-09 MED ORDER — ACETAMINOPHEN 500 MG PO TABS: 1000.0000 mg | ORAL_TABLET | Freq: Four times a day (QID) | ORAL | 0 refills | Status: DC | PRN

## 2017-08-09 MED ORDER — ACETAMINOPHEN 500 MG PO TABS: 1000.0000 mg | ORAL_TABLET | Freq: Four times a day (QID) | ORAL | 0 refills | Status: AC | PRN

## 2017-08-09 NOTE — ED Notes (Signed)
Pt unable to urinate at this time.  

## 2017-08-09 NOTE — Discharge Instructions (Signed)
Return here as needed.  follow-up with your doctor for recheck

## 2017-08-09 NOTE — ED Notes (Signed)
Pt refused to put a gown on. EDP at bedside talking to pt.

## 2017-08-09 NOTE — ED Triage Notes (Signed)
Pt to ER for evaluation of urinary symptoms relating to recent UTI, states was seen here and given IV antibiotics and has been taking his keflex but states his symptoms have not subsided and he hasn't been urinating as much. Pt in NAD. A.o x4.

## 2017-08-11 NOTE — ED Provider Notes (Signed)
[name] Provider Note   CSN: [id number] Arrival date & time: 08/09/17  1306     History   Chief Complaint Chief Complaint  Patient presents with  . Urinary Tract Infection    HPI Daniel Porter is a 76 y.o. male.  HPI Patient presents to the emergency department with continued urinary symptoms.  The patient states that he did not take his antibiotics that were previously prescribed.  The patient states that he did take some antibiotics recently but feels they did not work.  The patient denies chest pain, shortness of breath, headache,blurred vision, neck pain, fever, cough, weakness, numbness, dizziness, anorexia, edema, abdominal pain, nausea, vomiting, diarrhea, rash, back pain, \ hematemesis, bloody stool, near syncope, or syncope.  Patient describes a burning with urination Past Medical History:  Diagnosis Date  . Diabetes mellitus without complication (HCC)   . GERD (gastroesophageal reflux disease)   . Hyperlipidemia   . Hypertension   . Initial insomnia   . Umbilical hernia     Patient Active Problem List   Diagnosis Date Noted  . DM2 (diabetes mellitus, type 2) (HCC) 12/02/2016  . Sepsis secondary to UTI (HCC) 12/02/2016  . Pyohydronephrosis 12/02/2016  . Bladder mass 12/02/2016  . CKD (chronic kidney disease) stage 3, GFR 30-59 ml/min (HCC) 12/02/2016  . Recurrent umbilical hernia 10/17/2012  . S/P umbilical hernia repair, follow-up exam 11/30/2011  . Umbilical hernia 10/12/2011  . Right inguinal hernia 10/12/2011    Past Surgical History:  Procedure Laterality Date  . HERNIA REPAIR     2  . UMBILICAL HERNIA REPAIR  11/22/2011   Procedure: HERNIA REPAIR UMBILICAL ADULT;  Surgeon: Liz MaladyBurke E Thompson, MD;  [facility];  Service: General;  Laterality: N/A;       Home Medications    Prior to Admission medications   Medication Sig Start Date End Date Taking? Authorizing Provider  amLODipine (NORVASC) 5 MG  tablet Take 1 tablet (5 mg total) by mouth daily. 01/17/17  Yes Cheron SchaumannSofia, Leslie K, PA-C  docusate sodium (COLACE) 100 MG capsule Take 100 mg by mouth 2 (two) times daily.   Yes [provider]  finasteride (PROSCAR) 5 MG tablet Take 1 tablet (5 mg total) by mouth daily. 01/17/17  Yes Cheron SchaumannSofia, Leslie K, PA-C  ibuprofen (ADVIL,MOTRIN) 200 MG tablet Take 400 mg by mouth every 6 (six) hours as needed for headache or moderate pain.   Yes [provider]  acetaminophen (TYLENOL) 500 MG tablet Take 2 tablets (1,000 mg total) by mouth every 6 (six) hours as needed. 08/09/17   Ivory Bail, Cristal Deerhristopher, PA-C  cephALEXin (KEFLEX) 500 MG capsule Take 1 capsule (500 mg total) by mouth 2 (two) times daily for 7 days. Patient not taking: Reported on 08/09/2017 08/05/17 08/12/17  Tegeler, Canary Brimhristopher J, MD  nitrofurantoin, macrocrystal-monohydrate, (MACROBID) 100 MG capsule Take 1 capsule (100 mg total) by mouth 2 (two) times daily. 08/09/17   Gradie Butrick, Cristal Deerhristopher, PA-C  tamsulosin (FLOMAX) 0.4 MG CAPS capsule Take 1 capsule (0.4 mg total) by mouth daily after supper. Patient not taking: Reported on 08/05/2017 01/17/17   Osie CheeksSofia, Leslie K, PA-C    Family History No family history on file.  Social History Social History   Tobacco Use  . Smoking status: Never Smoker  . Smokeless tobacco: Never Used  Substance Use Topics  . Alcohol use: No  . Drug use: No     Allergies   Pork-derived products   Review of Systems Review  of Systems All other systems negative except as documented in the HPI. All pertinent positives and negatives as reviewed in the HPI. Physical Exam Updated Vital Signs BP (!) 131/101   Pulse 80   Temp 97.6 F (36.4 C) (Oral)   Resp 18   SpO2 98%   Physical Exam  Constitutional: He is oriented to person, place, and time. He appears well-developed and well-nourished. No distress.  HENT:  Head: Normocephalic and atraumatic.  Mouth/Throat: Oropharynx is clear and moist.  Eyes:  Pupils are equal, round, and reactive to light.  Neck: Normal range of motion. Neck supple.  Cardiovascular: Normal rate, regular rhythm and normal heart sounds. Exam reveals no gallop and no friction rub.  No murmur heard. Pulmonary/Chest: Effort normal and breath sounds normal. No respiratory distress. He has no wheezes.  Abdominal: Soft. Bowel sounds are normal. He exhibits no distension. There is no tenderness.  Neurological: He is alert and oriented to person, place, and time. He exhibits normal muscle tone. Coordination normal.  Skin: Skin is warm and dry. Capillary refill takes less than 2 seconds. No rash noted. No erythema.  Psychiatric: He has a normal mood and affect. His behavior is normal.  Nursing note and vitals reviewed.    ED Treatments / Results  Labs (all labs ordered are listed, but only abnormal results are displayed) Labs Reviewed  URINALYSIS, ROUTINE W REFLEX MICROSCOPIC - Abnormal; Notable for the following components:      Result Value   APPearance HAZY (*)    Glucose, UA >=500 (*)    Hgb urine dipstick LARGE (*)    Protein, ur 30 (*)    Leukocytes, UA LARGE (*)    Bacteria, UA RARE (*)    Squamous Epithelial / LPF 0-5 (*)    All other components within normal limits  BASIC METABOLIC PANEL - Abnormal; Notable for the following components:   Sodium 131 (*)    Chloride 100 (*)    Glucose, Bld 383 (*)    Creatinine, Ser 1.50 (*)    Calcium 10.5 (*)    GFR calc non Af Amer 43 (*)    GFR calc Af Amer 50 (*)    All other components within normal limits  CBC    EKG  EKG Interpretation None       Radiology No results found.  Procedures Procedures (including critical care time)  Medications Ordered in ED Medications - No data to display   Initial Impression / Assessment and Plan / ED Course  I have reviewed the triage vital signs and the nursing notes.  Pertinent labs & imaging results that were available during my care of the patient were  reviewed by me and considered in my medical decision making (see chart for details).     Patient is advised he will need to take his full course of antibiotics.  I advised him to return here as needed.  Told to follow-up with his primary doctor.  Vision stable here in the emergency department and only requesting antibiotics at this time.  Final Clinical Impressions(s) / ED Diagnoses   Final diagnoses:  Lower urinary tract infectious disease    ED Discharge Orders        Ordered    nitrofurantoin, macrocrystal-monohydrate, (MACROBID) 100 MG capsule  2 times daily,   Status:  Discontinued     08/09/17 1712    acetaminophen (TYLENOL) 500 MG tablet  Every 6 hours PRN,   Status:  Discontinued  08/09/17 1712    acetaminophen (TYLENOL) 500 MG tablet  Every 6 hours PRN     08/09/17 1723    nitrofurantoin, macrocrystal-monohydrate, (MACROBID) 100 MG capsule  2 times daily     08/09/17 1723       Charlestine NightLawyer, Mahin Guardia, PA-C 08/11/17 0127    Abelino DerrickMackuen, Courteney Lyn, MD 08/13/17 [id number]

## 2017-08-21 ENCOUNTER — Encounter (HOSPITAL_COMMUNITY): Payer: Self-pay | Admitting: Emergency Medicine

## 2017-08-21 ENCOUNTER — Ambulatory Visit (HOSPITAL_COMMUNITY)
Admission: EM | Admit: 2017-08-21 | Discharge: 2017-08-21 | Disposition: A | Discharge status: Home / Self Care | Payer: Medicare Other | Attending: Family Medicine | Admitting: Family Medicine

## 2017-08-21 DIAGNOSIS — K429 Umbilical hernia without obstruction or gangrene: Secondary | ICD-10-CM | POA: Diagnosis not present

## 2017-08-21 DIAGNOSIS — R300 Dysuria: Secondary | ICD-10-CM | POA: Insufficient documentation

## 2017-08-21 DIAGNOSIS — I10 Essential (primary) hypertension: Secondary | ICD-10-CM | POA: Diagnosis not present

## 2017-08-21 DIAGNOSIS — K219 Gastro-esophageal reflux disease without esophagitis: Secondary | ICD-10-CM | POA: Diagnosis not present

## 2017-08-21 DIAGNOSIS — R350 Frequency of micturition: Secondary | ICD-10-CM | POA: Insufficient documentation

## 2017-08-21 DIAGNOSIS — E785 Hyperlipidemia, unspecified: Secondary | ICD-10-CM | POA: Diagnosis not present

## 2017-08-21 DIAGNOSIS — E1165 Type 2 diabetes mellitus with hyperglycemia: Secondary | ICD-10-CM | POA: Diagnosis not present

## 2017-08-21 LAB — POCT URINALYSIS DIP (DEVICE)
BILIRUBIN URINE: NEGATIVE
Glucose, UA: 500 mg/dL — AB
Ketones, ur: NEGATIVE mg/dL
Nitrite: NEGATIVE
PH: 6 (ref 5.0–8.0)
Protein, ur: 100 mg/dL — AB
Specific Gravity, Urine: 1.015 (ref 1.005–1.030)
Urobilinogen, UA: 0.2 mg/dL (ref 0.0–1.0)

## 2017-08-21 MED ORDER — GLIMEPIRIDE 2 MG PO TABS: 2.0000 mg | ORAL_TABLET | Freq: Two times a day (BID) | ORAL | 1 refills | Status: DC

## 2017-08-21 NOTE — ED Provider Notes (Signed)
MRN: [id number] DOB: 04-08-1941  Subjective:   Daniel Porter is a 77 y.o. male presenting for 10 day history of urinary frequency, dysuria. Chart review reveals that symptoms have been much more chronic. Patient states that from his last OV to the ER on 08/09/2017, he was started on antibiotic for cystitis but was later called and advised to stop all medications he was prescribed from the ER. He denies fever, hematuria, genital rash, abdominal pain, flank pain. He has uncontrolled diabetes and is not taking medications for this. Diet is non-compliant, states that he wants to keep his blood sugar up because he is afraid of it going to low.   No current facility-administered medications for this encounter.   Current Outpatient Medications:  .  acetaminophen (TYLENOL) 500 MG tablet, Take 2 tablets (1,000 mg total) by mouth every 6 (six) hours as needed., Disp: 30 tablet, Rfl: 0 .  amLODipine (NORVASC) 5 MG tablet, Take 1 tablet (5 mg total) by mouth daily., Disp: 30 tablet, Rfl: 1 .  docusate sodium (COLACE) 100 MG capsule, Take 100 mg by mouth 2 (two) times daily., Disp: , Rfl:  .  tamsulosin (FLOMAX) 0.4 MG CAPS capsule, Take 1 capsule (0.4 mg total) by mouth daily after supper., Disp: 30 capsule, Rfl: 1 .  finasteride (PROSCAR) 5 MG tablet, Take 1 tablet (5 mg total) by mouth daily., Disp: 30 tablet, Rfl: 1 .  glimepiride (AMARYL) 2 MG tablet, Take 1 tablet (2 mg total) by mouth 2 (two) times daily with a meal., Disp: 60 tablet, Rfl: 1 .  ibuprofen (ADVIL,MOTRIN) 200 MG tablet, Take 400 mg by mouth every 6 (six) hours as needed for headache or moderate pain., Disp: , Rfl:  .  nitrofurantoin, macrocrystal-monohydrate, (MACROBID) 100 MG capsule, Take 1 capsule (100 mg total) by mouth 2 (two) times daily., Disp: 10 capsule, Rfl: 0   Daniel Porter is allergic to pork-derived products.  Daniel Porter  has a past medical history of Diabetes mellitus without complication (HCC), GERD (gastroesophageal reflux  disease), Hyperlipidemia, Hypertension, Initial insomnia, and Umbilical hernia. Also  has a past surgical history that includes Hernia repair and Umbilical hernia repair (11/22/2011).  Objective:   Vitals: BP (!) 150/91   Pulse 77   Temp 97.6 F (36.4 C) (Oral)   Resp 16   Wt 196 lb (88.9 kg)   SpO2 97%   BMI 27.34 kg/m   Physical Exam  Constitutional: He is oriented to person, place, and time. He appears well-developed and well-nourished.  Cardiovascular: Normal rate, regular rhythm and intact distal pulses. Exam reveals no gallop and no friction rub.  No murmur heard. Pulmonary/Chest: No respiratory distress. He has no wheezes. He has no rales.  Abdominal: Soft. Bowel sounds are normal. He exhibits no distension and no mass. There is no tenderness. There is no rebound and no guarding.  No CVA tenderness.  Neurological: He is alert and oriented to person, place, and time.  Skin: Skin is warm and dry.   Results for orders placed or performed during the hospital encounter of 08/21/17 (from the past 24 hour(s))  POCT urinalysis dip (device)     Status: Abnormal   Collection Time: 08/21/17  3:31 PM  Result Value Ref Range   Glucose, UA 500 (A) NEGATIVE mg/dL   Bilirubin Urine NEGATIVE NEGATIVE   Ketones, ur NEGATIVE NEGATIVE mg/dL   Specific Gravity, Urine 1.015 1.005 - 1.030   Hgb urine dipstick LARGE (A) NEGATIVE   pH 6.0 5.0 - 8.0  Protein, ur 100 (A) NEGATIVE mg/dL   Urobilinogen, UA 0.2 0.0 - 1.0 mg/dL   Nitrite NEGATIVE NEGATIVE   Leukocytes, UA SMALL (A) NEGATIVE   Assessment and Plan :   Uncontrolled type 2 diabetes mellitus with hyperglycemia (HCC)  Urinary frequency  Dysuria   Urine culture pending. I emphasized need for diabetes management, discussed risks/complications of uncontrolled diabetes. I will have patient start glimepiride. Counseled on dietary modifications and need to set up PCP for diabetes management. Will rx antibiotics pending urine culture.  Return-to-clinic precautions discussed, patient verbalized understanding.    Daniel Porter, Daniel Porter, [place]C 08/21/17 2102

## 2017-08-21 NOTE — ED Triage Notes (Signed)
PT reports dysuria and frequent urination for 10 days. PT reports several visits to offices recently.

## 2017-08-23 LAB — URINE CULTURE

## 2017-08-26 ENCOUNTER — Ambulatory Visit (HOSPITAL_COMMUNITY)
Admission: EM | Admit: 2017-08-26 | Discharge: 2017-08-26 | Disposition: A | Discharge status: Home / Self Care | Payer: Medicare Other | Attending: Family Medicine | Admitting: Family Medicine

## 2017-08-26 ENCOUNTER — Encounter (HOSPITAL_COMMUNITY): Payer: Self-pay | Admitting: *Deleted

## 2017-08-26 ENCOUNTER — Ambulatory Visit (INDEPENDENT_AMBULATORY_CARE_PROVIDER_SITE_OTHER): Payer: Medicare Other

## 2017-08-26 ENCOUNTER — Other Ambulatory Visit: Payer: Self-pay

## 2017-08-26 DIAGNOSIS — K5900 Constipation, unspecified: Secondary | ICD-10-CM | POA: Diagnosis not present

## 2017-08-26 DIAGNOSIS — R109 Unspecified abdominal pain: Secondary | ICD-10-CM | POA: Diagnosis not present

## 2017-08-26 HISTORY — DX: Cerebral infarction, unspecified: I63.9

## 2017-08-26 MED ORDER — POLYETHYLENE GLYCOL 3350 17 G PO PACK: 17.0000 g | PACK | Freq: Every day | ORAL | 0 refills | Status: DC

## 2017-08-26 NOTE — ED Triage Notes (Signed)
C/O constipation x 6 days, but able to pass gas.  Denies pain.

## 2017-08-26 NOTE — ED Provider Notes (Signed)
[facility]    CSN: [id number] Arrival date & time: 08/26/17  1751     History   Chief Complaint Chief Complaint  Patient presents with  . Constipation    HPI Daniel Porter is a [age] y.o. male.   77 year old male, with history of GERD, hyperlipidemia, hypertension, diabetes, presenting today complaining of constipation.  States that he has not had a bowel movement in the past 5-6 days.  Patient does have a history of a prior hernia surgery.  However, he denies any abdominal pain, nausea or vomiting.  Has been using stool softeners at home without much relief.  Per the son, he has been able to pass gas   The history is provided by the patient.  Constipation  Severity:  Moderate Time since last bowel movement:  5 days Timing:  Constant Progression:  Unchanged Chronicity:  Recurrent Context: medication   Context: not dehydration, not dietary changes, not narcotics and not stress   Stool description:  None produced Unusual stool frequency:  Daily Relieved by:  Nothing Worsened by:  Nothing Ineffective treatments:  Stool softeners Associated symptoms: no abdominal pain, no anorexia, no back pain, no diarrhea, no dysuria, no fever, no flatus, no hematochezia, no nausea, no urinary retention and no vomiting   Risk factors: hx of abdominal surgery   Risk factors: no change in medication, no obesity, no recent antibiotic use, no recent illness, no recent surgery and no recent travel     Past Medical History:  Diagnosis Date  . Diabetes mellitus without complication (HCC)   . GERD (gastroesophageal reflux disease)   . Hyperlipidemia   . Hypertension   . Initial insomnia   . Stroke (HCC)   . Umbilical hernia     Patient Active Problem List   Diagnosis Date Noted  . DM2 (diabetes mellitus, type 2) (HCC) 12/02/2016  . Sepsis secondary to UTI (HCC) 12/02/2016  . Pyohydronephrosis 12/02/2016  . Bladder mass 12/02/2016  . CKD (chronic kidney disease) stage 3,  GFR 30-59 ml/min (HCC) 12/02/2016  . Recurrent umbilical hernia 10/17/2012  . S/P umbilical hernia repair, follow-up exam 11/30/2011  . Umbilical hernia 10/12/2011  . Right inguinal hernia 10/12/2011    Past Surgical History:  Procedure Laterality Date  . HERNIA REPAIR     2  . UMBILICAL HERNIA REPAIR  11/22/2011   Procedure: HERNIA REPAIR UMBILICAL ADULT;  Surgeon: Liz MaladyBurke E Thompson, MD;  [facility];  Service: General;  Laterality: N/A;       Home Medications    Prior to Admission medications   Medication Sig Start Date End Date Taking? Authorizing Provider  acetaminophen (TYLENOL) 500 MG tablet Take 2 tablets (1,000 mg total) by mouth every 6 (six) hours as needed. 08/09/17  Yes Lawyer, Cristal Deerhristopher, PA-C  amLODipine (NORVASC) 5 MG tablet Take 1 tablet (5 mg total) by mouth daily. 01/17/17  Yes Cheron SchaumannSofia, Leslie K, PA-C  docusate sodium (COLACE) 100 MG capsule Take 100 mg by mouth 2 (two) times daily.   Yes [provider]  finasteride (PROSCAR) 5 MG tablet Take 1 tablet (5 mg total) by mouth daily. 01/17/17  Yes Cheron SchaumannSofia, Leslie K, PA-C  glimepiride (AMARYL) 2 MG tablet Take 1 tablet (2 mg total) by mouth 2 (two) times daily with a meal. 08/21/17  Yes Wallis BambergMani, Mario, PA-C  ibuprofen (ADVIL,MOTRIN) 200 MG tablet Take 400 mg by mouth every 6 (six) hours as needed for headache or moderate pain.    [provider]  nitrofurantoin, macrocrystal-monohydrate, (MACROBID) 100 MG capsule Take 1 capsule (100 mg total) by mouth 2 (two) times daily. 08/09/17   Lawyer, Cristal Deer, PA-C  polyethylene glycol (MIRALAX / GLYCOLAX) packet Take 17 g by mouth daily. 08/26/17   Idell Hissong C, PA-C  tamsulosin (FLOMAX) 0.4 MG CAPS capsule Take 1 capsule (0.4 mg total) by mouth daily after supper. 01/17/17   Elson Areas, PA-C    Family History History reviewed. No pertinent family history.  Social History Social History   Tobacco Use  . Smoking status: Never Smoker  . Smokeless tobacco:  Never Used  Substance Use Topics  . Alcohol use: No  . Drug use: No     Allergies   Pork-derived products   Review of Systems Review of Systems  Constitutional: Negative for chills and fever.  HENT: Negative for ear pain and sore throat.   Eyes: Negative for pain and visual disturbance.  Respiratory: Negative for cough and shortness of breath.   Cardiovascular: Negative for chest pain and palpitations.  Gastrointestinal: Positive for constipation. Negative for abdominal pain, anorexia, diarrhea, flatus, hematochezia, nausea and vomiting.  Genitourinary: Negative for dysuria and hematuria.  Musculoskeletal: Negative for arthralgias and back pain.  Skin: Negative for color change and rash.  Neurological: Negative for seizures and syncope.  All other systems reviewed and are negative.    Physical Exam Triage Vital Signs ED Triage Vitals  Enc Vitals Group     BP 08/26/17 1809 (!) 154/101     Pulse Rate 08/26/17 1809 80     Resp 08/26/17 1809 18     Temp 08/26/17 1809 98.2 F (36.8 C)     Temp Source 08/26/17 1809 Oral     SpO2 08/26/17 1809 98 %     Weight --      Height --      Head Circumference --      Peak Flow --      Pain Score 08/26/17 1812 0     Pain Loc --      Pain Edu? --      Excl. in GC? --    No data found.  Updated Vital Signs BP (!) 154/101   Pulse 80   Temp 98.2 F (36.8 C) (Oral)   Resp 18   SpO2 98%   Visual Acuity Right Eye Distance:   Left Eye Distance:   Bilateral Distance:    Right Eye Near:   Left Eye Near:    Bilateral Near:     Physical Exam  Constitutional: He appears well-developed and well-nourished.  HENT:  Head: Normocephalic and atraumatic.  Eyes: Conjunctivae are normal.  Neck: Neck supple.  Cardiovascular: Normal rate and regular rhythm.  No murmur heard. Pulmonary/Chest: Effort normal and breath sounds normal. No respiratory distress.  Abdominal: Soft. Bowel sounds are normal. There is no tenderness.    Musculoskeletal: He exhibits no edema.  Neurological: He is alert.  Skin: Skin is warm and dry.  Psychiatric: He has a normal mood and affect.  Nursing note and vitals reviewed.    UC Treatments / Results  Labs (all labs ordered are listed, but only abnormal results are displayed) Labs Reviewed - No data to display  EKG  EKG Interpretation None       Radiology Dg Abd 1 View  Result Date: 08/26/2017 CLINICAL DATA:  Constipation and abdomen pain for 2 days. EXAM: ABDOMEN - 1 VIEW COMPARISON:  None. FINDINGS: The bowel gas pattern is normal. Extensive bowel content  is identified throughout colon. No radio-opaque calculi or other significant radiographic abnormality are seen. IMPRESSION: No bowel obstruction. Extensive bowel content identified throughout colon consistent with constipation. Electronically Signed   By: Sherian Rein M.D.   On: 08/26/2017 18:36    Procedures Procedures (including critical care time)  Medications Ordered in UC Medications - No data to display   Initial Impression / Assessment and Plan / UC Course  I have reviewed the triage vital signs and the nursing notes.  Pertinent labs & imaging results that were available during my care of the patient were reviewed by me and considered in my medical decision making (see chart for details).     Normal x-ray without evidence of obstruction.  Will treat with MiraLAX  Final Clinical Impressions(s) / UC Diagnoses   Final diagnoses:  Constipation, unspecified constipation type    ED Discharge Orders        Ordered    polyethylene glycol (MIRALAX / GLYCOLAX) packet  Daily     08/26/17 1841       Controlled Substance Prescriptions [place] Controlled Substance Registry consulted? Not Applicable   Alecia Lemming, [place] 08/26/17 1845

## 2017-08-27 ENCOUNTER — Encounter (HOSPITAL_COMMUNITY): Payer: Self-pay | Admitting: Emergency Medicine

## 2017-08-27 ENCOUNTER — Other Ambulatory Visit: Payer: Self-pay

## 2017-08-27 ENCOUNTER — Emergency Department (HOSPITAL_COMMUNITY)
Admission: EM | Admit: 2017-08-27 | Discharge: 2017-08-27 | Disposition: A | Discharge status: Home / Self Care | Payer: Medicare Other | Attending: Emergency Medicine | Admitting: Emergency Medicine

## 2017-08-27 DIAGNOSIS — N183 Chronic kidney disease, stage 3 (moderate): Secondary | ICD-10-CM | POA: Insufficient documentation

## 2017-08-27 DIAGNOSIS — R252 Cramp and spasm: Secondary | ICD-10-CM | POA: Insufficient documentation

## 2017-08-27 DIAGNOSIS — K5900 Constipation, unspecified: Secondary | ICD-10-CM | POA: Insufficient documentation

## 2017-08-27 DIAGNOSIS — I129 Hypertensive chronic kidney disease with stage 1 through stage 4 chronic kidney disease, or unspecified chronic kidney disease: Secondary | ICD-10-CM | POA: Diagnosis not present

## 2017-08-27 DIAGNOSIS — M79604 Pain in right leg: Secondary | ICD-10-CM | POA: Insufficient documentation

## 2017-08-27 DIAGNOSIS — Z79899 Other long term (current) drug therapy: Secondary | ICD-10-CM | POA: Insufficient documentation

## 2017-08-27 DIAGNOSIS — E1122 Type 2 diabetes mellitus with diabetic chronic kidney disease: Secondary | ICD-10-CM | POA: Diagnosis not present

## 2017-08-27 DIAGNOSIS — M79662 Pain in left lower leg: Secondary | ICD-10-CM | POA: Diagnosis present

## 2017-08-27 DIAGNOSIS — Z8673 Personal history of transient ischemic attack (TIA), and cerebral infarction without residual deficits: Secondary | ICD-10-CM | POA: Insufficient documentation

## 2017-08-27 NOTE — ED Provider Notes (Signed)
[name] EMERGENCY DEPARTMENT Provider Note   CSN: [id number] Arrival date & time: 08/27/17  2057     History   Chief Complaint Chief Complaint  Patient presents with  . Leg Pain  . Constipation    HPI Daniel Porter is a 77 y.o. male.  Daniel Porter is a 77 y.o. Male who presents to the ED complaining of constipation for five days and bilateral leg cramping today.  Patient reports that today he had cramping in both his right and his left calf.  This has come and gone just twice today.  He denies any current pain to his legs.  He denies any leg swelling.  He also reports that he has been constipated for the past 5 days and has had no bowel movement in 5 days.  He is passing gas.  He denies any abdominal pain or vomiting.  He was seen by urgent care yesterday where they had an x-ray of his abdomen that showed no evidence of obstruction.  He was provided with GoLYTELY, which she is picked up from the pharmacy but has not taken yet.  He denies any rectal pain  He denies any other muscle cramping or pain.  He denies fevers, chest pain, shortness of breath, abdominal pain, vomiting, diarrhea, urinary symptoms trouble urinating, rashes, leg swelling, or coughing.    The history is provided by the patient, medical records and a relative. The history is limited by a language barrier. A language interpreter was used.  Leg Pain    Constipation   Pertinent negatives include no abdominal pain and no dysuria.    Past Medical History:  Diagnosis Date  . Diabetes mellitus without complication (HCC)   . GERD (gastroesophageal reflux disease)   . Hyperlipidemia   . Hypertension   . Initial insomnia   . Stroke (HCC)   . Umbilical hernia     Patient Active Problem List   Diagnosis Date Noted  . DM2 (diabetes mellitus, type 2) (HCC) 12/02/2016  . Sepsis secondary to UTI (HCC) 12/02/2016  . Pyohydronephrosis 12/02/2016  . Bladder mass 12/02/2016  . CKD (chronic  kidney disease) stage 3, GFR 30-59 ml/min (HCC) 12/02/2016  . Recurrent umbilical hernia 10/17/2012  . S/P umbilical hernia repair, follow-up exam 11/30/2011  . Umbilical hernia 10/12/2011  . Right inguinal hernia 10/12/2011    Past Surgical History:  Procedure Laterality Date  . HERNIA REPAIR     2  . UMBILICAL HERNIA REPAIR  11/22/2011   Procedure: HERNIA REPAIR UMBILICAL ADULT;  Surgeon: Liz Malady, MD;  Location: [facility] OR;  Service: General;  Laterality: N/A;       Home Medications    Prior to Admission medications   Medication Sig Start Date End Date Taking? Authorizing Provider  acetaminophen (TYLENOL) 500 MG tablet Take 2 tablets (1,000 mg total) by mouth every 6 (six) hours as needed. 08/09/17   Lawyer, Cristal Deer, PA-C  amLODipine (NORVASC) 5 MG tablet Take 1 tablet (5 mg total) by mouth daily. 01/17/17   Elson Areas, PA-C  docusate sodium (COLACE) 100 MG capsule Take 100 mg by mouth 2 (two) times daily.    [provider]  finasteride (PROSCAR) 5 MG tablet Take 1 tablet (5 mg total) by mouth daily. 01/17/17   Elson Areas, PA-C  glimepiride (AMARYL) 2 MG tablet Take 1 tablet (2 mg total) by mouth 2 (two) times daily with a meal. 08/21/17   Wallis Bamberg, PA-C  ibuprofen (ADVIL,MOTRIN)  200 MG tablet Take 400 mg by mouth every 6 (six) hours as needed for headache or moderate pain.    [provider]  nitrofurantoin, macrocrystal-monohydrate, (MACROBID) 100 MG capsule Take 1 capsule (100 mg total) by mouth 2 (two) times daily. 08/09/17   Lawyer, Cristal Deer, PA-C  polyethylene glycol (MIRALAX / GLYCOLAX) packet Take 17 g by mouth daily. 08/26/17   Blue, Olivia C, PA-C  tamsulosin (FLOMAX) 0.4 MG CAPS capsule Take 1 capsule (0.4 mg total) by mouth daily after supper. 01/17/17   Elson Areas, PA-C    Family History No family history on file.  Social History Social History   Tobacco Use  . Smoking status: Never Smoker  . Smokeless tobacco: Never Used    Substance Use Topics  . Alcohol use: No  . Drug use: No     Allergies   Pork-derived products   Review of Systems Review of Systems  Constitutional: Negative for chills and fever.  HENT: Negative for congestion and sore throat.   Eyes: Negative for visual disturbance.  Respiratory: Negative for cough and shortness of breath.   Cardiovascular: Negative for chest pain, palpitations and leg swelling.  Gastrointestinal: Positive for constipation. Negative for abdominal pain, diarrhea, nausea and vomiting.  Genitourinary: Negative for dysuria.  Musculoskeletal: Positive for arthralgias. Negative for back pain and neck pain.  Skin: Negative for rash.  Neurological: Negative for headaches.     Physical Exam Updated Vital Signs BP (!) 149/93 (BP Location: Left Arm)   Pulse 84   Temp 97.6 F (36.4 C) (Oral)   Resp 18   SpO2 95%   Physical Exam  Constitutional: He appears well-developed and well-nourished. No distress.  HENT:  Head: Normocephalic and atraumatic.  Mouth/Throat: Oropharynx is clear and moist.  Eyes: Conjunctivae are normal. Pupils are equal, round, and reactive to light. Right eye exhibits no discharge. Left eye exhibits no discharge.  Neck: Neck supple.  Cardiovascular: Normal rate, regular rhythm, normal heart sounds and intact distal pulses. Exam reveals no gallop and no friction rub.  No murmur heard. Bilateral dorsalis pedis and posterior tibialis pulses are intact.  Pulmonary/Chest: Effort normal and breath sounds normal. No respiratory distress. He has no wheezes. He has no rales.  Abdominal: Soft. Bowel sounds are normal. He exhibits no distension and no mass. There is no tenderness. There is no guarding.  Abdomen is soft and nontender to palpation.  Bowel sounds are present.  Musculoskeletal: Normal range of motion. He exhibits no edema, tenderness or deformity.  No lower extremity edema or tenderness.  No calf tenderness bilaterally.  Lymphadenopathy:     He has no cervical adenopathy.  Neurological: He is alert. Coordination normal.  Skin: Skin is warm and dry. No rash noted. He is not diaphoretic. No erythema. No pallor.  Psychiatric: He has a normal mood and affect. His behavior is normal.  Nursing note and vitals reviewed.    ED Treatments / Results  Labs (all labs ordered are listed, but only abnormal results are displayed) Labs Reviewed - No data to display  EKG  EKG Interpretation None       Radiology Dg Abd 1 View  Result Date: 08/26/2017 CLINICAL DATA:  Constipation and abdomen pain for 2 days. EXAM: ABDOMEN - 1 VIEW COMPARISON:  None. FINDINGS: The bowel gas pattern is normal. Extensive bowel content is identified throughout colon. No radio-opaque calculi or other significant radiographic abnormality are seen. IMPRESSION: No bowel obstruction. Extensive bowel content identified throughout colon consistent  with constipation. Electronically Signed   By: Sherian Rein M.D.   On: 08/26/2017 18:36    Procedures Procedures (including critical care time)  Medications Ordered in ED Medications - No data to display   Initial Impression / Assessment and Plan / ED Course  I have reviewed the triage vital signs and the nursing notes.  Pertinent labs & imaging results that were available during my care of the patient were reviewed by me and considered in my medical decision making (see chart for details).  Clinical Course as of Aug 28 2211  Sun Aug 27, 2017  8344 77 year old male here with his son complaining of ongoing constipation and 2 episodes of leg cramping today.  Patient was very angry with [place] from the very beginning and is refusing any kind of evaluation or blood work.  We made multiple attempts to work with the patient so we can help him but ultimately he got up and left the emergency department.  [MB]    Clinical Course User Index [MB] Terrilee Files, MD     This  is a 77 y.o. Male who presents to the ED  complaining of constipation for five days and bilateral leg cramping today.  Patient reports that today he had cramping in both his right and his left calf.  This has come and gone just twice today.  He denies any current pain to his legs.  He denies any leg swelling.  He also reports that he has been constipated for the past 5 days and has had no bowel movement in 5 days.  He is passing gas.  He denies any abdominal pain or vomiting.  He was seen by urgent care yesterday where they had an x-ray of his abdomen that showed no evidence of obstruction.  He was provided with GoLYTELY, which she is picked up from the pharmacy but has not taken yet.  He denies any rectal pain  He denies any other muscle cramping or pain. On exam the patient is afebrile nontoxic-appearing.  His abdomen is soft and nontender to palpation.  Bowel sounds are present.  He has no lower extremity edema or tenderness.  He is neurovascularly intact.  Normal gait. I discussed the plan with the patient that I would like to check some blood work as I am concerned his leg cramping may be related to an electrolyte imbalance.  Patient tells me he does not want me to check any more of his blood.  He tells me that the hospital is checked his blood many times in the past and he wants me to look at his previous blood work.  I explained that because this is a new problem I felt like he should have new blood work drawn today as his most recent blood work is from 3 weeks ago.  I again explained the concern I had with a possible electrolyte imbalance.  Patient again declines blood work and tells me he just wants me to give him a pill.  I explained that I could not provide him a treatment if I was unsure to the cause of his leg cramping.  I explained that the blood work would help [place] determine the cause of his leg cramping.  He is upset and tells me he does not want any blood work or any additional testing here.  He tells me he just wants a pill.  We discussed  at length with the patient and his son about plan and reasons for further  workup.  He still declines.  He wants to be discharged. I spoke with my attending who went to evaluate the patient as well. He refuses any work up and wants to be discharged.  Will discharge.  I encouraged him to use the GoLYTELY as provided by the urgent care office.  I also encouraged him to follow-up closely with his primary care doctor.  Final Clinical Impressions(s) / ED Diagnoses   Final diagnoses:  Constipation, unspecified constipation type  Leg cramps    ED Discharge Orders    None       Lorene DyDansie, Mayerli Kirst, PA-C 08/27/17 2218    Terrilee FilesButler, Michael C, MD 08/27/17 [phone]

## 2017-08-27 NOTE — ED Notes (Signed)
Pt left prior to receiving discharge paperwork, vital signs or signature.

## 2017-08-27 NOTE — ED Triage Notes (Signed)
Reports having pain in BLE for the last week.  Reports getting out of bed one day and legs were hurting so he went back to leg.  During triage also c/o no BM for 6-7 days.  Denies having any pain in abdomen.

## 2017-09-10 ENCOUNTER — Other Ambulatory Visit: Payer: Self-pay

## 2017-09-10 ENCOUNTER — Ambulatory Visit (HOSPITAL_COMMUNITY)
Admission: EM | Admit: 2017-09-10 | Discharge: 2017-09-10 | Disposition: A | Discharge status: Home / Self Care | Payer: Medicare Other | Attending: Family Medicine | Admitting: Family Medicine

## 2017-09-10 ENCOUNTER — Encounter (HOSPITAL_COMMUNITY): Payer: Self-pay | Admitting: *Deleted

## 2017-09-10 DIAGNOSIS — K5900 Constipation, unspecified: Secondary | ICD-10-CM

## 2017-09-10 HISTORY — DX: Constipation, unspecified: K59.00

## 2017-09-10 MED ORDER — LINACLOTIDE 72 MCG PO CAPS: 72.0000 ug | ORAL_CAPSULE | Freq: Every day | ORAL | 0 refills | Status: AC

## 2017-09-10 MED ORDER — POLYETHYLENE GLYCOL 3350 17 G PO PACK: 17.0000 g | PACK | Freq: Every day | ORAL | 0 refills | Status: DC | PRN

## 2017-09-10 NOTE — ED Provider Notes (Signed)
[facility]    CSN: [id number] Arrival date & time: 09/10/17  1218     History   Chief Complaint Chief Complaint  Patient presents with  . Constipation  . Medication Refill    HPI Daniel Porter is a 77 y.o. male.   77 y.o. Male, presents today for constipation. His LBM was 3 days ago. He reports hard stool with straining. He normally has a BM daily to every other day. No abdominal pain. No nausea. Is passing gas. Also wants refill on his Linzess 72 mcg.       Past Medical History:  Diagnosis Date  . Constipation   . Diabetes mellitus without complication (HCC)   . GERD (gastroesophageal reflux disease)   . Hyperlipidemia   . Hypertension   . Initial insomnia   . Stroke (HCC)   . Umbilical hernia     Patient Active Problem List   Diagnosis Date Noted  . DM2 (diabetes mellitus, type 2) (HCC) 12/02/2016  . Sepsis secondary to UTI (HCC) 12/02/2016  . Pyohydronephrosis 12/02/2016  . Bladder mass 12/02/2016  . CKD (chronic kidney disease) stage 3, GFR 30-59 ml/min (HCC) 12/02/2016  . Recurrent umbilical hernia 10/17/2012  . S/P umbilical hernia repair, follow-up exam 11/30/2011  . Umbilical hernia 10/12/2011  . Right inguinal hernia 10/12/2011    Past Surgical History:  Procedure Laterality Date  . HERNIA REPAIR     2  . UMBILICAL HERNIA REPAIR  11/22/2011   Procedure: HERNIA REPAIR UMBILICAL ADULT;  Surgeon: Liz MaladyBurke E Thompson, MD;  [facility];  Service: General;  Laterality: N/A;       Home Medications    Prior to Admission medications   Medication Sig Start Date End Date Taking? Authorizing Provider  amLODipine (NORVASC) 5 MG tablet Take 1 tablet (5 mg total) by mouth daily. 01/17/17  Yes Cheron SchaumannSofia, Leslie K, PA-C  docusate sodium (COLACE) 100 MG capsule Take 100 mg by mouth 2 (two) times daily.   Yes [provider]  finasteride (PROSCAR) 5 MG tablet Take 1 tablet (5 mg total) by mouth daily. 01/17/17  Yes Cheron SchaumannSofia, Leslie K, PA-C    glimepiride (AMARYL) 2 MG tablet Take 1 tablet (2 mg total) by mouth 2 (two) times daily with a meal. 08/21/17  Yes Wallis BambergMani, Mario, PA-C  tamsulosin (FLOMAX) 0.4 MG CAPS capsule Take 1 capsule (0.4 mg total) by mouth daily after supper. 01/17/17  Yes Elson AreasSofia, Leslie K, PA-C  acetaminophen (TYLENOL) 500 MG tablet Take 2 tablets (1,000 mg total) by mouth every 6 (six) hours as needed. 08/09/17   Lawyer, Cristal Deerhristopher, PA-C  ibuprofen (ADVIL,MOTRIN) 200 MG tablet Take 400 mg by mouth every 6 (six) hours as needed for headache or moderate pain.    [provider]  linaclotide Karlene Einstein(LINZESS) 72 MCG capsule Take 1 capsule (72 mcg total) by mouth daily before breakfast. 09/10/17 12/09/17  Lucia EstelleZheng, Venera Privott, NP  nitrofurantoin, macrocrystal-monohydrate, (MACROBID) 100 MG capsule Take 1 capsule (100 mg total) by mouth 2 (two) times daily. 08/09/17   Lawyer, Cristal Deerhristopher, PA-C  polyethylene glycol (MIRALAX / GLYCOLAX) packet Take 17 g by mouth daily as needed. 09/10/17 10/10/17  Lucia EstelleZheng, Kingsley Farace, NP    Family History No family history on file.  Social History Social History   Tobacco Use  . Smoking status: Never Smoker  . Smokeless tobacco: Never Used  Substance Use Topics  . Alcohol use: No  . Drug use: No     Allergies   Pork-derived products  Review of Systems Review of Systems  Constitutional: Negative for chills, fatigue and fever.  Respiratory: Negative.   Cardiovascular: Negative.   Gastrointestinal: Positive for constipation. Negative for abdominal pain, blood in stool, nausea, rectal pain and vomiting.  Neurological: Negative for dizziness and headaches.     Physical Exam Triage Vital Signs ED Triage Vitals  Enc Vitals Group     BP 09/10/17 1304 137/90     Pulse Rate 09/10/17 1304 76     Resp 09/10/17 1304 18     Temp 09/10/17 1304 97.6 F (36.4 C)     Temp Source 09/10/17 1304 Oral     SpO2 09/10/17 1304 97 %     Weight --      Height --      Head Circumference --      Peak Flow --       Pain Score 09/10/17 1306 0     Pain Loc --      Pain Edu? --      Excl. in GC? --    No data found.  Updated Vital Signs BP 137/90   Pulse 76   Temp 97.6 F (36.4 C) (Oral)   Resp 18   SpO2 97%   Visual Acuity Right Eye Distance:   Left Eye Distance:   Bilateral Distance:    Right Eye Near:   Left Eye Near:    Bilateral Near:     Physical Exam  Constitutional: He is oriented to person, place, and time. He appears well-developed and well-nourished.  Cardiovascular: Normal rate, regular rhythm and normal heart sounds.  Pulmonary/Chest: Effort normal and breath sounds normal.  Abdominal: Soft. Bowel sounds are normal. He exhibits no distension. There is no tenderness. A hernia is present.  Neurological: He is alert and oriented to person, place, and time.  Skin: Skin is warm and dry.  Nursing note and vitals reviewed.    UC Treatments / Results  Labs (all labs ordered are listed, but only abnormal results are displayed) Labs Reviewed - No data to display  EKG  EKG Interpretation None       Radiology No results found.  Procedures Procedures (including critical care time)  Medications Ordered in UC Medications - No data to display   Initial Impression / Assessment and Plan / UC Course  I have reviewed the triage vital signs and the nursing notes.  Pertinent labs & imaging results that were available during my care of the patient were reviewed by me and considered in my medical decision making (see chart for details).  Final Clinical Impressions(s) / UC Diagnoses   Final diagnoses:  Constipation, unspecified constipation type   Linzess refilled Start Miralax Today.  Reviewed directions for usage and side effects. Patient states understanding and will call with questions or problems. Patient instructed to call or follow up with his/her primary care doctor if failure to improve or change in symptoms. Discharge instruction given.   ED Discharge Orders         Ordered    linaclotide (LINZESS) 72 MCG capsule  Daily before breakfast     09/10/17 1335    polyethylene glycol (MIRALAX / GLYCOLAX) packet  Daily PRN     09/10/17 1335     Controlled Substance Prescriptions [place] Controlled Substance Registry consulted? Not Applicable   Lucia Estelle, NP 09/10/17 1337

## 2017-09-10 NOTE — ED Triage Notes (Signed)
Pt c/o contipation again; no BM x 2-3 days.  Requesting refill for Miralax, despite stating it doesn't work well for him; also requesting a "different med" for constipation.  Denies pain at present.

## 2017-09-13 ENCOUNTER — Encounter (HOSPITAL_COMMUNITY): Payer: Self-pay | Admitting: Emergency Medicine

## 2017-09-13 ENCOUNTER — Ambulatory Visit (INDEPENDENT_AMBULATORY_CARE_PROVIDER_SITE_OTHER): Payer: Medicare Other

## 2017-09-13 ENCOUNTER — Other Ambulatory Visit: Payer: Self-pay

## 2017-09-13 ENCOUNTER — Ambulatory Visit (HOSPITAL_COMMUNITY)
Admission: EM | Admit: 2017-09-13 | Discharge: 2017-09-13 | Disposition: A | Discharge status: Home / Self Care | Payer: Medicare Other | Attending: Family Medicine | Admitting: Family Medicine

## 2017-09-13 DIAGNOSIS — M150 Primary generalized (osteo)arthritis: Secondary | ICD-10-CM | POA: Diagnosis not present

## 2017-09-13 DIAGNOSIS — M79642 Pain in left hand: Secondary | ICD-10-CM

## 2017-09-13 DIAGNOSIS — L231 Allergic contact dermatitis due to adhesives: Secondary | ICD-10-CM | POA: Diagnosis not present

## 2017-09-13 MED ORDER — PREDNISONE 5 MG PO TABS: 5.0000 mg | ORAL_TABLET | Freq: Every day | ORAL | 0 refills | Status: DC

## 2017-09-13 MED ORDER — POLYETHYLENE GLYCOL 3350 17 G PO PACK: 17.0000 g | PACK | Freq: Every day | ORAL | 0 refills | Status: AC | PRN

## 2017-09-13 MED FILL — predniSONE 5 MG TABS: 5 | 5 days supply | Qty: 5 | Fill #0

## 2017-09-13 NOTE — ED Triage Notes (Signed)
Left hand pain.  Patient had an iv in left hand one month ago.

## 2017-09-13 NOTE — ED Provider Notes (Signed)
[facility]   [id number] 09/13/17 Arrival Time: 1016   SUBJECTIVE:  Daniel Porter is a 77 y.o. male who presents to the urgent care with complaint of Left hand pain.  Patient had an iv in left hand one month ago.   Patient is sure there was trauma after several different providers try to insert an IV in his left hand.   Past Medical History:  Diagnosis Date  . Constipation   . Diabetes mellitus without complication (HCC)   . GERD (gastroesophageal reflux disease)   . Hyperlipidemia   . Hypertension   . Initial insomnia   . Stroke (HCC)   . Umbilical hernia    History reviewed. No pertinent family history. Social History   Socioeconomic History  . Marital status: Married    Spouse name: Not on file  . Number of children: Not on file  . Years of education: Not on file  . Highest education level: Not on file  Social Needs  . Financial resource strain: Not on file  . Food insecurity - worry: Not on file  . Food insecurity - inability: Not on file  . Transportation needs - medical: Not on file  . Transportation needs - non-medical: Not on file  Occupational History  . Not on file  Tobacco Use  . Smoking status: Never Smoker  . Smokeless tobacco: Never Used  Substance and Sexual Activity  . Alcohol use: No  . Drug use: No  . Sexual activity: Not on file  Other Topics Concern  . Not on file  Social History Narrative  . Not on file   No outpatient medications have been marked as taking for the 09/13/17 encounter [facility] Encounter).   Allergies  Allergen Reactions  . Pork-Derived Products Other (See Comments)    Pt doesn't consume any pork products      ROS: As per HPI, remainder of ROS negative.   OBJECTIVE:   Vitals:   09/13/17 1112  BP: 136/77  Pulse: 71  Resp: 20  Temp: 98.6 F (37 C)  SpO2: 98%     General appearance: alert; no distress Eyes: PERRL; EOMI; conjunctiva normal HENT: normocephalic; atraumatic;oral mucosa  normal Neck: supple Back: no CVA tenderness Extremities: no cyanosis or edema; symmetrical with no gross deformities; reddened skin where tape has been applied to dorsum left hand.  No bony abnormality noted. Skin: warm and dry Neurologic: normal gait; grossly normal Psychological: alert and cooperative; normal mood and affect      Labs:  Results for orders placed or performed during the hospital encounter of 08/21/17  Urine culture  Result Value Ref Range   Specimen Description URINE, RANDOM    Special Requests NONE    Culture MULTIPLE SPECIES PRESENT, SUGGEST RECOLLECTION (A)    Report Status 08/23/2017 FINAL   POCT urinalysis dip (device)  Result Value Ref Range   Glucose, UA 500 (A) NEGATIVE mg/dL   Bilirubin Urine NEGATIVE NEGATIVE   Ketones, ur NEGATIVE NEGATIVE mg/dL   Specific Gravity, Urine 1.015 1.005 - 1.030   Hgb urine dipstick LARGE (A) NEGATIVE   pH 6.0 5.0 - 8.0   Protein, ur 100 (A) NEGATIVE mg/dL   Urobilinogen, UA 0.2 0.0 - 1.0 mg/dL   Nitrite NEGATIVE NEGATIVE   Leukocytes, UA SMALL (A) NEGATIVE    Labs Reviewed - No data to display  Dg Hand Complete Left  Result Date: 09/13/2017 CLINICAL DATA:  77 year old male persistent left hand pain following IV access last month. Mid  metacarpal region pain and burning sensation. Diabetic. EXAM: LEFT HAND - COMPLETE 3+ VIEW COMPARISON:  None. FINDINGS: Degenerative changes of the 1st carpometacarpal joint and distal ulna with subchondral sclerosis, subchondral lucency, and some chronic appearing fragmentation. Other joint spaces are normal for age. No acute fracture or dislocation identified. No radiopaque foreign body identified. No subcutaneous gas identified. IMPRESSION: 1. No acute radiographic abnormality identified in the left hand. 2. 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 Electronically Signed   By: Odessa FlemingH  Hall M.D.   On: 09/13/2017 12:05       ASSESSMENT &  PLAN:  1. Primary generalized (osteo)arthritis   2. Allergic contact dermatitis due to adhesives     Meds ordered this encounter  Medications  . polyethylene glycol (MIRALAX / GLYCOLAX) packet    Sig: Take 17 g by mouth daily as needed.    Dispense:  30 each    Refill:  0  . predniSONE (DELTASONE) 5 MG tablet    Sig: Take 1 tablet (5 mg total) by mouth daily with breakfast.    Dispense:  5 tablet    Refill:  0    Reviewed expectations re: course of current medical issues. Questions answered. Outlined signs and symptoms indicating need for more acute intervention. Patient verbalized understanding. After Visit Summary given.    Procedures: Do not use tape on hand anymore.  Instead, use the Coban elastic wrap provided.      Elvina SidleLauenstein, Aikam Hellickson, MD 09/13/17 1229

## 2017-09-13 NOTE — Discharge Instructions (Signed)
Do not use tape on hand anymore.  Instead, use the Coban elastic wrap provided.  EXAM: LEFT HAND - COMPLETE 3+ VIEW   COMPARISON:  None.   FINDINGS: Degenerative changes of the 1st carpometacarpal joint and distal ulna with subchondral sclerosis, subchondral lucency, and some chronic appearing fragmentation. Other joint spaces are normal for age. No acute fracture or dislocation identified.   No radiopaque foreign body identified. No subcutaneous gas identified.   IMPRESSION: 1. No acute radiographic abnormality identified in the left hand. 2. 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     Electronically Signed   By: Odessa FlemingH  Hall M.D.   On: 09/13/2017 12:05

## 2017-09-14 ENCOUNTER — Ambulatory Visit (HOSPITAL_COMMUNITY)
Admission: EM | Admit: 2017-09-14 | Discharge: 2017-09-14 | Disposition: A | Discharge status: Home / Self Care | Payer: Medicare Other | Attending: Family Medicine | Admitting: Family Medicine

## 2017-09-14 ENCOUNTER — Other Ambulatory Visit: Payer: Self-pay

## 2017-09-14 ENCOUNTER — Encounter (HOSPITAL_COMMUNITY): Payer: Self-pay | Admitting: Emergency Medicine

## 2017-09-14 DIAGNOSIS — K5901 Slow transit constipation: Secondary | ICD-10-CM

## 2017-09-14 MED ORDER — LACTULOSE 10 GM/15ML PO SOLN: 10.0000 g | Freq: Two times a day (BID) | ORAL | 0 refills | Status: DC | PRN

## 2017-09-14 MED FILL — GENERLAC 10 GM/15 ML SOLN: 10 | 8 days supply | Qty: 240 | Fill #0

## 2017-09-14 NOTE — ED Provider Notes (Signed)
[facility]   [id number] 09/14/17 Arrival Time: 1524   SUBJECTIVE:  Daniel Porter is a 77 y.o. male who presents to the urgent care with complaint of chronic constipation unresponsive to current medications. He has been taking colace and miralax.  Pain in left hand much better.  Past Medical History:  Diagnosis Date  . Constipation   . Diabetes mellitus without complication (HCC)   . GERD (gastroesophageal reflux disease)   . Hyperlipidemia   . Hypertension   . Initial insomnia   . Stroke (HCC)   . Umbilical hernia    History reviewed. No pertinent family history. Social History   Socioeconomic History  . Marital status: Married    Spouse name: Not on file  . Number of children: Not on file  . Years of education: Not on file  . Highest education level: Not on file  Social Needs  . Financial resource strain: Not on file  . Food insecurity - worry: Not on file  . Food insecurity - inability: Not on file  . Transportation needs - medical: Not on file  . Transportation needs - non-medical: Not on file  Occupational History  . Not on file  Tobacco Use  . Smoking status: Never Smoker  . Smokeless tobacco: Never Used  Substance and Sexual Activity  . Alcohol use: No  . Drug use: No  . Sexual activity: Not on file  Other Topics Concern  . Not on file  Social History Narrative  . Not on file   No outpatient medications have been marked as taking for the 09/14/17 encounter [facility](Hospital Encounter).   Allergies  Allergen Reactions  . Pork-Derived Products Other (See Comments)    Pt doesn't consume any pork products      ROS: As per HPI, remainder of ROS negative.   OBJECTIVE:   Vitals:   09/14/17 1554  BP: (!) 145/84  Pulse: 76  Resp: 18  Temp: 97.8 F (36.6 C)  SpO2: 100%     General appearance: alert; no distress Eyes: PERRL; EOMI; conjunctiva normal HENT: normocephalic; atraumatic;  nasal mucosa normal; oral mucosa normal Neck:  supple Abdomen: soft, non-tender; bowel sounds normal; no masses or organomegaly; no guarding or rebound tenderness Back: no CVA tenderness Extremities: no cyanosis or edema; symmetrical with no gross deformities Skin: warm and dry Neurologic: normal gait; grossly normal Psychological: alert and cooperative; normal mood and affect      Labs:  Results for orders placed or performed during the hospital encounter of 08/21/17  Urine culture  Result Value Ref Range   Specimen Description URINE, RANDOM    Special Requests NONE    Culture MULTIPLE SPECIES PRESENT, SUGGEST RECOLLECTION (A)    Report Status 08/23/2017 FINAL   POCT urinalysis dip (device)  Result Value Ref Range   Glucose, UA 500 (A) NEGATIVE mg/dL   Bilirubin Urine NEGATIVE NEGATIVE   Ketones, ur NEGATIVE NEGATIVE mg/dL   Specific Gravity, Urine 1.015 1.005 - 1.030   Hgb urine dipstick LARGE (A) NEGATIVE   pH 6.0 5.0 - 8.0   Protein, ur 100 (A) NEGATIVE mg/dL   Urobilinogen, UA 0.2 0.0 - 1.0 mg/dL   Nitrite NEGATIVE NEGATIVE   Leukocytes, UA SMALL (A) NEGATIVE    Labs Reviewed - No data to display  No results found.     ASSESSMENT & PLAN:  1. Slow transit constipation     Meds ordered this encounter  Medications  . lactulose (CHRONULAC) 10 GM/15ML solution  Sig: Take 15 mLs (10 g total) by mouth 2 (two) times daily as needed for mild constipation.    Dispense:  240 mL    Refill:  0    Reviewed expectations re: course of current medical issues. Questions answered. Outlined signs and symptoms indicating need for more acute intervention. Patient verbalized understanding. After Visit Summary given.      Elvina Sidle, MD 09/14/17 [phone]

## 2017-09-14 NOTE — Discharge Instructions (Signed)
Take the Miralax twice daily:  1 capful in 8 oz water morning and night Take Colace once a day Take Lactulose 15 ml twice a day for 3 days.

## 2017-09-14 NOTE — ED Triage Notes (Signed)
Pt is taking some meds for constipation and he states they dont work and he needs something different.

## 2017-09-18 ENCOUNTER — Other Ambulatory Visit: Payer: Self-pay

## 2017-09-18 ENCOUNTER — Encounter (HOSPITAL_COMMUNITY): Payer: Self-pay | Admitting: Family Medicine

## 2017-09-18 ENCOUNTER — Ambulatory Visit (HOSPITAL_COMMUNITY)
Admission: EM | Admit: 2017-09-18 | Discharge: 2017-09-18 | Disposition: A | Discharge status: Home / Self Care | Payer: Medicare Other | Attending: Family Medicine | Admitting: Family Medicine

## 2017-09-18 DIAGNOSIS — M79642 Pain in left hand: Secondary | ICD-10-CM | POA: Diagnosis not present

## 2017-09-18 MED ORDER — PREDNISONE 1 MG PO TABS
1.0000 mg | ORAL_TABLET | Freq: Every day | ORAL | 0 refills | Status: DC
Start: 2017-09-18 — End: 2017-10-18

## 2017-09-18 NOTE — ED Provider Notes (Signed)
[facility]   [id number] 09/18/17 Arrival Time: 1201   SUBJECTIVE:  Daniel Porter is a 77 y.o. male who presents to the urgent care with complaint of needing medication refill.  He also wants his hand rewrapped.       Past Medical History:  Diagnosis Date  . Constipation   . Diabetes mellitus without complication (HCC)   . GERD (gastroesophageal reflux disease)   . Hyperlipidemia   . Hypertension   . Initial insomnia   . Stroke (HCC)   . Umbilical hernia    History reviewed. No pertinent family history. Social History   Socioeconomic History  . Marital status: Married    Spouse name: Not on file  . Number of children: Not on file  . Years of education: Not on file  . Highest education level: Not on file  Social Needs  . Financial resource strain: Not on file  . Food insecurity - worry: Not on file  . Food insecurity - inability: Not on file  . Transportation needs - medical: Not on file  . Transportation needs - non-medical: Not on file  Occupational History  . Not on file  Tobacco Use  . Smoking status: Never Smoker  . Smokeless tobacco: Never Used  Substance and Sexual Activity  . Alcohol use: No  . Drug use: No  . Sexual activity: Not on file  Other Topics Concern  . Not on file  Social History Narrative  . Not on file   No outpatient medications have been marked as taking for the 09/18/17 encounter [facility] Encounter).   Allergies  Allergen Reactions  . Pork-Derived Products Other (See Comments)    Pt doesn't consume any pork products      ROS: As per HPI, remainder of ROS negative.   OBJECTIVE:   Vitals:   09/18/17 1321  BP: 133/79  Pulse: 78  Resp: 18  Temp: 97.8 F (36.6 C)  TempSrc: Oral  SpO2: 96%     General appearance: alert; no distress Eyes: PERRL; EOMI; conjunctiva normal HENT: normocephalic; atraumatic; TMs normal, canal normal, external ears normal without trauma; nasal mucosa normal; oral mucosa  normal Neck: supple Lungs: clear to auscultation bilaterally Heart: regular rate and rhythm Abdomen: soft, non-tender; bowel sounds normal; no masses or organomegaly; no guarding or rebound tenderness Back: no CVA tenderness Extremities: no cyanosis or edema; symmetrical with no gross deformities Skin: warm and dry; scaly dry skin left dorsal hand Neurologic: normal gait; grossly normal Psychological: alert and cooperative; normal mood and affect      Labs:  Results for orders placed or performed during the hospital encounter of 08/21/17  Urine culture  Result Value Ref Range   Specimen Description URINE, RANDOM    Special Requests NONE    Culture MULTIPLE SPECIES PRESENT, SUGGEST RECOLLECTION (A)    Report Status 08/23/2017 FINAL   POCT urinalysis dip (device)  Result Value Ref Range   Glucose, UA 500 (A) NEGATIVE mg/dL   Bilirubin Urine NEGATIVE NEGATIVE   Ketones, ur NEGATIVE NEGATIVE mg/dL   Specific Gravity, Urine 1.015 1.005 - 1.030   Hgb urine dipstick LARGE (A) NEGATIVE   pH 6.0 5.0 - 8.0   Protein, ur 100 (A) NEGATIVE mg/dL   Urobilinogen, UA 0.2 0.0 - 1.0 mg/dL   Nitrite NEGATIVE NEGATIVE   Leukocytes, UA SMALL (A) NEGATIVE    Labs Reviewed - No data to display  No results found.     ASSESSMENT & PLAN:  1. Left  hand pain     Meds ordered this encounter  Medications  . predniSONE (DELTASONE) 1 MG tablet    Sig: Take 1 tablet (1 mg total) by mouth daily with breakfast.    Dispense:  5 tablet    Refill:  0    Reviewed expectations re: course of current medical issues. Questions answered. Outlined signs and symptoms indicating need for more acute intervention. Patient verbalized understanding. After Visit Summary given.    Procedures:      Elvina SidleLauenstein, Acy Orsak, MD 09/18/17 1332

## 2017-09-18 NOTE — Discharge Instructions (Signed)
You may wash routinely without keeping the bandage on now.  Follow up with your doctor in a week.

## 2017-09-18 NOTE — ED Triage Notes (Signed)
Sen by provider only

## 2017-09-18 NOTE — ED Notes (Signed)
Triaged by provider  

## 2017-09-22 ENCOUNTER — Encounter (HOSPITAL_COMMUNITY): Payer: Self-pay | Admitting: Emergency Medicine

## 2017-09-22 ENCOUNTER — Ambulatory Visit (HOSPITAL_COMMUNITY)
Admission: EM | Admit: 2017-09-22 | Discharge: 2017-09-22 | Disposition: A | Discharge status: Home / Self Care | Payer: Medicare Other | Attending: Internal Medicine | Admitting: Internal Medicine

## 2017-09-22 DIAGNOSIS — M79602 Pain in left arm: Secondary | ICD-10-CM

## 2017-09-22 MED ORDER — NAPROXEN 250 MG PO TABS: 250.0000 mg | ORAL_TABLET | Freq: Every day | ORAL | 0 refills | Status: AC

## 2017-09-22 NOTE — ED Provider Notes (Signed)
[facility]    CSN: [id number] Arrival date & time: 09/22/17  1749     History   Chief Complaint Chief Complaint  Patient presents with  . Arm Pain    HPI Daniel Porter is a 77 y.o. male.   Daniel Porter presents with complaints of left upper arm pain for the past few days. Was his left hand pain after he was poked multiple times for iv/blood draw. Pain is worse with weight bearing to the arm. Generalized to upper arm. No known trauma or injury. Without redness, swelling or tenderness. Without numbness or tingling. Took course of prednisone, provided on 2/4 for hand pain. Hand pain has improved.    ROS per HPI.       Past Medical History:  Diagnosis Date  . Constipation   . Diabetes mellitus without complication (HCC)   . GERD (gastroesophageal reflux disease)   . Hyperlipidemia   . Hypertension   . Initial insomnia   . Stroke (HCC)   . Umbilical hernia     Patient Active Problem List   Diagnosis Date Noted  . DM2 (diabetes mellitus, type 2) (HCC) 12/02/2016  . Sepsis secondary to UTI (HCC) 12/02/2016  . Pyohydronephrosis 12/02/2016  . Bladder mass 12/02/2016  . CKD (chronic kidney disease) stage 3, GFR 30-59 ml/min (HCC) 12/02/2016  . Recurrent umbilical hernia 10/17/2012  . S/P umbilical hernia repair, follow-up exam 11/30/2011  . Umbilical hernia 10/12/2011  . Right inguinal hernia 10/12/2011    Past Surgical History:  Procedure Laterality Date  . HERNIA REPAIR     2  . UMBILICAL HERNIA REPAIR  11/22/2011   Procedure: HERNIA REPAIR UMBILICAL ADULT;  Surgeon: Liz MaladyBurke E Thompson, MD;  [facility];  Service: General;  Laterality: N/A;       Home Medications    Prior to Admission medications   Medication Sig Start Date End Date Taking? Authorizing Provider  amLODipine (NORVASC) 5 MG tablet Take 1 tablet (5 mg total) by mouth daily. 01/17/17  Yes Cheron SchaumannSofia, Leslie K, PA-C  finasteride (PROSCAR) 5 MG tablet Take 1 tablet (5 mg total) by mouth daily.  01/17/17  Yes Cheron SchaumannSofia, Leslie K, PA-C  glimepiride (AMARYL) 2 MG tablet Take 1 tablet (2 mg total) by mouth 2 (two) times daily with a meal. 08/21/17  Yes Wallis BambergMani, Mario, PA-C  lactulose (CHRONULAC) 10 GM/15ML solution Take 15 mLs (10 g total) by mouth 2 (two) times daily as needed for mild constipation. 09/14/17  Yes Elvina SidleLauenstein, Kurt, MD  linaclotide Karlene Einstein(LINZESS) 72 MCG capsule Take 1 capsule (72 mcg total) by mouth daily before breakfast. 09/10/17 12/09/17 Yes Lucia EstelleZheng, Feng, NP  tamsulosin (FLOMAX) 0.4 MG CAPS capsule Take 1 capsule (0.4 mg total) by mouth daily after supper. 01/17/17  Yes Elson AreasSofia, Leslie K, PA-C  acetaminophen (TYLENOL) 500 MG tablet Take 2 tablets (1,000 mg total) by mouth every 6 (six) hours as needed. 08/09/17   Lawyer, Cristal Deerhristopher, PA-C  docusate sodium (COLACE) 100 MG capsule Take 100 mg by mouth 2 (two) times daily.    [provider]  naproxen (NAPROSYN) 250 MG tablet Take 1 tablet (250 mg total) by mouth daily for 7 days. 09/22/17 09/29/17  Georgetta HaberBurky, [place] Serpe B, NP  polyethylene glycol (MIRALAX / GLYCOLAX) packet Take 17 g by mouth daily as needed. 09/13/17 10/13/17  Elvina SidleLauenstein, Kurt, MD  predniSONE (DELTASONE) 1 MG tablet Take 1 tablet (1 mg total) by mouth daily with breakfast. 09/18/17   Elvina SidleLauenstein, Kurt, MD    Family History  History reviewed. No pertinent family history.  Social History Social History   Tobacco Use  . Smoking status: Never Smoker  . Smokeless tobacco: Never Used  Substance Use Topics  . Alcohol use: No  . Drug use: No     Allergies   Pork-derived products   Review of Systems Review of Systems   Physical Exam Triage Vital Signs ED Triage Vitals  Enc Vitals Group     BP 09/22/17 1938 (!) 155/93     Pulse Rate 09/22/17 1938 70     Resp 09/22/17 1938 20     Temp 09/22/17 1938 97.8 F (36.6 C)     Temp Source 09/22/17 1938 Oral     SpO2 09/22/17 1938 100 %     Weight --      Height --      Head Circumference --      Peak Flow --      Pain Score  09/22/17 1939 7     Pain Loc --      Pain Edu? --      Excl. in GC? --    No data found.  Updated Vital Signs BP (!) 155/93 (BP Location: Left Arm)   Pulse 70   Temp 97.8 F (36.6 C) (Oral)   Resp 20   SpO2 100%   Visual Acuity Right Eye Distance:   Left Eye Distance:   Bilateral Distance:    Right Eye Near:   Left Eye Near:    Bilateral Near:     Physical Exam  Constitutional: He is oriented to person, place, and time. He appears well-developed and well-nourished.  Cardiovascular: Normal rate and regular rhythm.  Pulmonary/Chest: Effort normal and breath sounds normal.  Musculoskeletal:       Left upper arm: Normal.  Left dorsal hand with mild dermatitis in shape of dressing from iv; without tenderness; left arm without point tenderness; complete ROM present to wrist, elbow and shoulder; sensation intact; strong radial pulse.   Neurological: He is alert and oriented to person, place, and time.  Skin: Skin is warm and dry.     UC Treatments / Results  Labs (all labs ordered are listed, but only abnormal results are displayed) Labs Reviewed - No data to display  EKG  EKG Interpretation None       Radiology No results found.  Procedures Procedures (including critical care time)  Medications Ordered in UC Medications - No data to display   Initial Impression / Assessment and Plan / UC Course  I have reviewed the triage vital signs and the nursing notes.  Pertinent labs & imaging results that were available during my care of the patient were reviewed by me and considered in my medical decision making (see chart for details).     Without specific findings on exam to left arm. Full ROM and strength noted; non tender. Without injury. Imaging deferred. Naproxen 250mg  daily for pain recommended, heat and/or ice application. Activity as tolerated.Patient verbalized understanding and agreeable to plan.    Final Clinical Impressions(s) / UC Diagnoses   Final  diagnoses:  Left arm pain    ED Discharge Orders        Ordered    naproxen (NAPROSYN) 250 MG tablet  Daily     09/22/17 2029       Controlled Substance Prescriptions [place] Controlled Substance Registry consulted? Not Applicable   Georgetta Haber, NP 09/22/17 2042

## 2017-09-22 NOTE — ED Triage Notes (Signed)
PT C/O: left arm pain onset 1 week... st[facility] pain [facility]    A&O x4... NAD... Ambulatory

## 2017-09-22 NOTE — Discharge Instructions (Signed)
Apply heat and/or ice to left arm as needed. Naproxen daily for the next week for pain. Please follow up with primary care provider as needed for further evaluation if symptoms persist.

## 2017-09-25 ENCOUNTER — Ambulatory Visit (HOSPITAL_COMMUNITY)
Admission: EM | Admit: 2017-09-25 | Discharge: 2017-09-25 | Discharge status: Against Medical Advice | Payer: Medicare Other

## 2017-09-25 NOTE — ED Triage Notes (Signed)
Per pt access pt left again.

## 2017-09-25 NOTE — ED Notes (Signed)
Per pt access, pt had to leave and will be back

## 2017-09-26 ENCOUNTER — Encounter (HOSPITAL_COMMUNITY): Payer: Self-pay | Admitting: Emergency Medicine

## 2017-09-26 ENCOUNTER — Ambulatory Visit (HOSPITAL_COMMUNITY)
Admission: EM | Admit: 2017-09-26 | Discharge: 2017-09-26 | Disposition: A | Discharge status: Home / Self Care | Payer: Medicare Other | Attending: Family Medicine | Admitting: Family Medicine

## 2017-09-26 DIAGNOSIS — Z760 Encounter for issue of repeat prescription: Secondary | ICD-10-CM

## 2017-09-26 DIAGNOSIS — R0981 Nasal congestion: Secondary | ICD-10-CM | POA: Diagnosis not present

## 2017-09-26 DIAGNOSIS — R1010 Upper abdominal pain, unspecified: Secondary | ICD-10-CM | POA: Diagnosis not present

## 2017-09-26 DIAGNOSIS — K5900 Constipation, unspecified: Secondary | ICD-10-CM | POA: Diagnosis not present

## 2017-09-26 MED ORDER — LACTULOSE 10 GM/15ML PO SOLN: 10.0000 g | Freq: Two times a day (BID) | ORAL | 0 refills | Status: DC | PRN

## 2017-09-26 MED FILL — LACTULOSE 10 GM/15 ML SOLUT: 10 | 10 days supply | Qty: 300 | Fill #0

## 2017-09-26 NOTE — Discharge Instructions (Signed)
I have refilled your lactulose.  Please take zyrtec daily for congestion, you may also try flonase nasal spray daily.

## 2017-09-26 NOTE — ED Triage Notes (Signed)
PT reports he wants a refill for his stomach medicine that Dr. Milus GlazierLauenstein last prescribed.   Also reports congestion and runny nose for a few days.

## 2017-09-26 NOTE — ED Provider Notes (Signed)
[facility]    CSN: [id number] Arrival date & time: 09/26/17  1342     History   Chief Complaint Chief Complaint  Patient presents with  . Abdominal Pain    HPI Daniel Porter is a 77 y.o. male history of GERD, hyperlipidemia, hypertension, constipation, diabetes presenting today for med refill and nasal congestion.  Is requesting refill of the lactulose as this helped his constipation and is now out.  He denies any abdominal pain at this moment.  Last bowel movement was yesterday.  Had some nasal congestion that has been going on for the past couple of days, denies any cough, sore throat, fever.  Denies any nausea or vomiting.  HPI  Past Medical History:  Diagnosis Date  . Constipation   . Diabetes mellitus without complication (HCC)   . GERD (gastroesophageal reflux disease)   . Hyperlipidemia   . Hypertension   . Initial insomnia   . Stroke (HCC)   . Umbilical hernia     Patient Active Problem List   Diagnosis Date Noted  . DM2 (diabetes mellitus, type 2) (HCC) 12/02/2016  . Sepsis secondary to UTI (HCC) 12/02/2016  . Pyohydronephrosis 12/02/2016  . Bladder mass 12/02/2016  . CKD (chronic kidney disease) stage 3, GFR 30-59 ml/min (HCC) 12/02/2016  . Recurrent umbilical hernia 10/17/2012  . S/P umbilical hernia repair, follow-up exam 11/30/2011  . Umbilical hernia 10/12/2011  . Right inguinal hernia 10/12/2011    Past Surgical History:  Procedure Laterality Date  . HERNIA REPAIR     2  . UMBILICAL HERNIA REPAIR  11/22/2011   Procedure: HERNIA REPAIR UMBILICAL ADULT;  Surgeon: Liz Malady, MD;  Location: [facility] OR;  Service: General;  Laterality: N/A;       Home Medications    Prior to Admission medications   Medication Sig Start Date End Date Taking? Authorizing Provider  acetaminophen (TYLENOL) 500 MG tablet Take 2 tablets (1,000 mg total) by mouth every 6 (six) hours as needed. 08/09/17   Lawyer, Cristal Deer, PA-C  amLODipine (NORVASC) 5  MG tablet Take 1 tablet (5 mg total) by mouth daily. 01/17/17   Elson Areas, PA-C  docusate sodium (COLACE) 100 MG capsule Take 100 mg by mouth 2 (two) times daily.    [provider]  finasteride (PROSCAR) 5 MG tablet Take 1 tablet (5 mg total) by mouth daily. 01/17/17   Elson Areas, PA-C  glimepiride (AMARYL) 2 MG tablet Take 1 tablet (2 mg total) by mouth 2 (two) times daily with a meal. 08/21/17   Wallis Bamberg, PA-C  lactulose (CHRONULAC) 10 GM/15ML solution Take 15 mLs (10 g total) by mouth 2 (two) times daily as needed for mild constipation. 09/26/17   Jarrah Babich, Junius Creamer, PA-C  linaclotide (LINZESS) 72 MCG capsule Take 1 capsule (72 mcg total) by mouth daily before breakfast. 09/10/17 12/09/17  Lucia Estelle, NP  naproxen (NAPROSYN) 250 MG tablet Take 1 tablet (250 mg total) by mouth daily for 7 days. 09/22/17 09/29/17  Georgetta Haber, NP  polyethylene glycol (MIRALAX / GLYCOLAX) packet Take 17 g by mouth daily as needed. 09/13/17 10/13/17  Elvina Sidle, MD  predniSONE (DELTASONE) 1 MG tablet Take 1 tablet (1 mg total) by mouth daily with breakfast. 09/18/17   Elvina Sidle, MD  tamsulosin (FLOMAX) 0.4 MG CAPS capsule Take 1 capsule (0.4 mg total) by mouth daily after supper. 01/17/17   Elson Areas, PA-C    Family History No family history on file.  Social History Social History   Tobacco Use  . Smoking status: Never Smoker  . Smokeless tobacco: Never Used  Substance Use Topics  . Alcohol use: No  . Drug use: No     Allergies   Pork-derived products   Review of Systems Review of Systems  Constitutional: Negative for activity change, appetite change, fatigue and fever.  HENT: Positive for congestion and rhinorrhea. Negative for ear pain, postnasal drip, sinus pressure and sore throat.   Eyes: Negative for pain and itching.  Respiratory: Negative for cough and shortness of breath.   Cardiovascular: Negative for chest pain.  Gastrointestinal: Positive for constipation.  Negative for abdominal pain, diarrhea, nausea and vomiting.  Musculoskeletal: Negative for myalgias.  Skin: Negative for rash.  Neurological: Negative for dizziness, light-headedness and headaches.     Physical Exam Triage Vital Signs ED Triage Vitals  Enc Vitals Group     BP 09/26/17 1414 128/72     Pulse Rate 09/26/17 1414 76     Resp 09/26/17 1414 16     Temp 09/26/17 1414 98 F (36.7 C)     Temp Source 09/26/17 1414 Oral     SpO2 09/26/17 1414 99 %     Weight 09/26/17 1413 196 lb (88.9 kg)     Height --      Head Circumference --      Peak Flow --      Pain Score --      Pain Loc --      Pain Edu? --      Excl. in GC? --    No data found.  Updated Vital Signs BP 128/72   Pulse 76   Temp 98 F (36.7 C) (Oral)   Resp 16   Wt 196 lb (88.9 kg)   SpO2 99%   BMI 27.34 kg/m    Physical Exam  Constitutional: He appears well-developed and well-nourished.  Patient with accident difficult to understand, translated by grandson/son  HENT:  Head: Normocephalic and atraumatic.  Mildly erythematous nasal mucosa, rhinorrhea present.  Posterior oropharynx with mild erythema, no tonsillar enlargement or exudate.  Eyes: Conjunctivae are normal.  Neck: Neck supple.  Cardiovascular: Normal rate and regular rhythm.  No murmur heard. Pulmonary/Chest: Effort normal and breath sounds normal. No respiratory distress.  Abdominal: Soft. There is no tenderness.  Musculoskeletal: He exhibits no edema.  Neurological: He is alert.  Skin: Skin is warm and dry.  Psychiatric: He has a normal mood and affect.  Nursing note and vitals reviewed.    UC Treatments / Results  Labs (all labs ordered are listed, but only abnormal results are displayed) Labs Reviewed - No data to display  EKG  EKG Interpretation None       Radiology No results found.  Procedures Procedures (including critical care time)  Medications Ordered in UC Medications - No data to display   Initial  Impression / Assessment and Plan / UC Course  I have reviewed the triage vital signs and the nursing notes.  Pertinent labs & imaging results that were available during my care of the patient were reviewed by me and considered in my medical decision making (see chart for details).     Refill lactulose, provide recommendations for Zyrtec and Flonase over-the-counter for congestion. Discussed strict return precautions. Patient verbalized understanding and is agreeable with plan.   Final Clinical Impressions(s) / UC Diagnoses   Final diagnoses:  Medication refill  Nasal congestion    ED Discharge Orders  Ordered    lactulose (CHRONULAC) 10 GM/15ML solution  2 times daily PRN     09/26/17 1448       Controlled Substance Prescriptions [place] Controlled Substance Registry consulted? Not Applicable   Lew DawesWieters, Alazne Quant C, [place] JerseyPA-C 09/26/17 1806

## 2017-10-18 ENCOUNTER — Ambulatory Visit (HOSPITAL_COMMUNITY)
Admission: EM | Admit: 2017-10-18 | Discharge: 2017-10-18 | Disposition: A | Discharge status: Home / Self Care | Payer: Medicare Other | Attending: Family Medicine | Admitting: Family Medicine

## 2017-10-18 ENCOUNTER — Ambulatory Visit (INDEPENDENT_AMBULATORY_CARE_PROVIDER_SITE_OTHER): Payer: Medicare Other

## 2017-10-18 ENCOUNTER — Other Ambulatory Visit: Payer: Self-pay

## 2017-10-18 ENCOUNTER — Encounter (HOSPITAL_COMMUNITY): Payer: Self-pay | Admitting: Emergency Medicine

## 2017-10-18 DIAGNOSIS — G8929 Other chronic pain: Secondary | ICD-10-CM

## 2017-10-18 DIAGNOSIS — M25512 Pain in left shoulder: Secondary | ICD-10-CM

## 2017-10-18 MED ORDER — TRAMADOL HCL 50 MG PO TABS: 50.0000 mg | ORAL_TABLET | Freq: Four times a day (QID) | ORAL | 0 refills | Status: AC | PRN

## 2017-10-18 NOTE — ED Triage Notes (Signed)
left arm is painful.  Patient reports this has been treated, but no better.  Patient relates this to an iv stick

## 2017-10-21 ENCOUNTER — Ambulatory Visit (HOSPITAL_COMMUNITY)
Admission: EM | Admit: 2017-10-21 | Discharge: 2017-10-21 | Disposition: A | Discharge status: Home / Self Care | Payer: Medicare Other | Attending: Family Medicine | Admitting: Family Medicine

## 2017-10-21 ENCOUNTER — Other Ambulatory Visit: Payer: Self-pay

## 2017-10-21 DIAGNOSIS — K5901 Slow transit constipation: Secondary | ICD-10-CM

## 2017-10-21 NOTE — ED Provider Notes (Signed)
[facility]    CSN: [id number] Arrival date & time: 10/21/17  1755     History   Chief Complaint Chief Complaint  Patient presents with  . Medication Reaction    HPI Daniel Porter is a 77 y.o. male.   Daniel Porter presents with complaints of constipation. History of constipation, has not been taking miralax. Took lactulose yesterday but has not taken regularly. Had been some times since previous BM and had been bloated. Started docusate today per recommendation of pharmacist. Had a bowel movement here while waiting for visit and now is feeling improved.    ROS per HPI.       Past Medical History:  Diagnosis Date  . Constipation   . Diabetes mellitus without complication (HCC)   . GERD (gastroesophageal reflux disease)   . Hyperlipidemia   . Hypertension   . Initial insomnia   . Stroke (HCC)   . Umbilical hernia     Patient Active Problem List   Diagnosis Date Noted  . DM2 (diabetes mellitus, type 2) (HCC) 12/02/2016  . Sepsis secondary to UTI (HCC) 12/02/2016  . Pyohydronephrosis 12/02/2016  . Bladder mass 12/02/2016  . CKD (chronic kidney disease) stage 3, GFR 30-59 ml/min (HCC) 12/02/2016  . Recurrent umbilical hernia 10/17/2012  . S/P umbilical hernia repair, follow-up exam 11/30/2011  . Umbilical hernia 10/12/2011  . Right inguinal hernia 10/12/2011    Past Surgical History:  Procedure Laterality Date  . HERNIA REPAIR     2  . UMBILICAL HERNIA REPAIR  11/22/2011   Procedure: HERNIA REPAIR UMBILICAL ADULT;  Surgeon: Liz MaladyBurke E Thompson, MD;  Location: [facility] OR;  Service: General;  Laterality: N/A;       Home Medications    Prior to Admission medications   Medication Sig Start Date End Date Taking? Authorizing Provider  acetaminophen (TYLENOL) 500 MG tablet Take 2 tablets (1,000 mg total) by mouth every 6 (six) hours as needed. 08/09/17   Lawyer, Cristal Deerhristopher, PA-C  amLODipine (NORVASC) 5 MG tablet Take 1 tablet (5 mg total) by mouth daily.  01/17/17   Elson AreasSofia, Leslie K, PA-C  docusate sodium (COLACE) 100 MG capsule Take 100 mg by mouth 2 (two) times daily.    [provider]  finasteride (PROSCAR) 5 MG tablet Take 1 tablet (5 mg total) by mouth daily. 01/17/17   Elson AreasSofia, Leslie K, PA-C  glimepiride (AMARYL) 2 MG tablet Take 1 tablet (2 mg total) by mouth 2 (two) times daily with a meal. 08/21/17   Wallis BambergMani, Mario, PA-C  lactulose (CHRONULAC) 10 GM/15ML solution Take 15 mLs (10 g total) by mouth 2 (two) times daily as needed for mild constipation. 09/26/17   Wieters, Junius CreamerHallie C, PA-C  linaclotide (LINZESS) 72 MCG capsule Take 1 capsule (72 mcg total) by mouth daily before breakfast. 09/10/17 12/09/17  Lucia EstelleZheng, Feng, NP  tamsulosin (FLOMAX) 0.4 MG CAPS capsule Take 1 capsule (0.4 mg total) by mouth daily after supper. 01/17/17   Elson AreasSofia, Leslie K, PA-C  traMADol (ULTRAM) 50 MG tablet Take 1 tablet (50 mg total) by mouth every 6 (six) hours as needed. 10/18/17   Mardella LaymanHagler, Brian, MD    Family History No family history on file.  Social History Social History   Tobacco Use  . Smoking status: Never Smoker  . Smokeless tobacco: Never Used  Substance Use Topics  . Alcohol use: No  . Drug use: No     Allergies   Pork-derived products   Review of Systems Review of Systems  Physical Exam Triage Vital Signs ED Triage Vitals [10/21/17 1853]  Enc Vitals Group     BP (!) 142/99     Pulse Rate 84     Resp      Temp 97.6 F (36.4 C)     Temp Source Oral     SpO2 98 %     Weight      Height      Head Circumference      Peak Flow      Pain Score 0     Pain Loc      Pain Edu?      Excl. in GC?    No data found.  Updated Vital Signs BP (!) 142/99 (BP Location: Left Arm)   Pulse 84   Temp 97.6 F (36.4 C) (Oral)   SpO2 98%   Visual Acuity Right Eye Distance:   Left Eye Distance:   Bilateral Distance:    Right Eye Near:   Left Eye Near:    Bilateral Near:     Physical Exam  Constitutional: He is oriented to person, place,  and time. He appears well-developed and well-nourished.  Cardiovascular: Normal rate and regular rhythm.  Pulmonary/Chest: Effort normal and breath sounds normal.  Abdominal: There is no tenderness.  Mild abdominal distention noted   Neurological: He is alert and oriented to person, place, and time.  Skin: Skin is warm and dry.     UC Treatments / Results  Labs (all labs ordered are listed, but only abnormal results are displayed) Labs Reviewed - No data to display  EKG  EKG Interpretation None       Radiology No results found.  Procedures Procedures (including critical care time)  Medications Ordered in UC Medications - No data to display   Initial Impression / Assessment and Plan / UC Course  I have reviewed the triage vital signs and the nursing notes.  Pertinent labs & imaging results that were available during my care of the patient were reviewed by me and considered in my medical decision making (see chart for details).     Patient now took docusate and passing BM. Discussed may take tid tomorrow and then to decrease to BID. miralax and/or lactulose PRN. Increase water and fiber intake. Patient verbalized understanding and agreeable to plan.    Final Clinical Impressions(s) / UC Diagnoses   Final diagnoses:  Slow transit constipation    ED Discharge Orders    None       Controlled Substance Prescriptions [place] Controlled Substance Registry consulted? Not Applicable   Georgetta Haber, NP 10/21/17 Barry Brunner

## 2017-10-21 NOTE — ED Triage Notes (Signed)
Per pt his stool softener is not working.

## 2017-10-23 NOTE — ED Provider Notes (Signed)
[facility]   [id number] 10/18/17 Arrival Time: 1833  ASSESSMENT & PLAN:  1. Chronic left shoulder pain     Imaging: Dg Shoulder Left  Result Date: 10/18/2017 CLINICAL DATA:  77 y/o  M; persistent left shoulder pain. EXAM: LEFT SHOULDER - 2+ VIEW COMPARISON:  None. FINDINGS: There is no evidence of fracture or dislocation. There is no evidence of arthropathy or other focal bone abnormality. Soft tissues are unremarkable. IMPRESSION: Negative. Electronically Signed   By: Mitzi Hansen M.D.   On: 10/18/2017 19:53    Meds ordered this encounter  Medications  . traMADol (ULTRAM) 50 MG tablet    Sig: Take 1 tablet (50 mg total) by mouth every 6 (six) hours as needed.    Dispense:  15 tablet    Refill:  0   Medication sedation precautions discussed. Question arthritic in nature. Discussed. Will plan f/u with PCP. May benefit from physical therapy. May f/u here as needed.  Reviewed expectations re: course of current medical issues. Questions answered. Outlined signs and symptoms indicating need for more acute intervention. Patient verbalized understanding. After Visit Summary given.  SUBJECTIVE: History from: patient and family. Daniel Porter is a 77 y.o. male who reports intermittent poorly localized mild to moderate pain of his left shoulder that is stable; described as aching without radiation. Onset: gradual, "for a long time now"; several months. Injury/trama: no. Relieved by: rest. Worsened by: certain movements. Associated symptoms: none reported. Extremity sensation changes or weakness: none. Self treatment: tried OTCs with relief of pain. No specific triggers of shoulder discomfort.  ROS: As per HPI.   OBJECTIVE:  Vitals:   10/18/17 1903  BP: 115/73  Pulse: 77  Resp: 18  Temp: 97.9 F (36.6 C)  TempSrc: Oral  SpO2: 96%    General appearance: alert; no distress Extremities: no cyanosis or edema; symmetrical with no gross deformities;  poorly localized tenderness over his left shoulder, more anteriorly, with no swelling and no bruising; ROM: normal but with reported discomfort CV: normal extremity capillary refill Skin: warm and dry Neurologic: normal symmetric reflexes in all extremities; normal sensation in all extremities Psychological: alert and cooperative; normal mood and affect  Allergies  Allergen Reactions  . Pork-Derived Products Other (See Comments)    Pt doesn't consume any pork products    Past Medical History:  Diagnosis Date  . Constipation   . Diabetes mellitus without complication (HCC)   . GERD (gastroesophageal reflux disease)   . Hyperlipidemia   . Hypertension   . Initial insomnia   . Stroke (HCC)   . Umbilical hernia    Social History   Socioeconomic History  . Marital status: Married    Spouse name: Not on file  . Number of children: Not on file  . Years of education: Not on file  . Highest education level: Not on file  Social Needs  . Financial resource strain: Not on file  . Food insecurity - worry: Not on file  . Food insecurity - inability: Not on file  . Transportation needs - medical: Not on file  . Transportation needs - non-medical: Not on file  Occupational History  . Not on file  Tobacco Use  . Smoking status: Never Smoker  . Smokeless tobacco: Never Used  Substance and Sexual Activity  . Alcohol use: No  . Drug use: No  . Sexual activity: Not on file  Other Topics Concern  . Not on file  Social History Narrative  . Not  on file   No family history on file. Past Surgical History:  Procedure Laterality Date  . HERNIA REPAIR     2  . UMBILICAL HERNIA REPAIR  11/22/2011   Procedure: HERNIA REPAIR UMBILICAL ADULT;  Surgeon: Liz MaladyBurke E Thompson, MD;  [facility];  Service: General;  Laterality: Vertis KelchN/A;      [name], MD 10/23/17 [phone]

## 2017-11-04 ENCOUNTER — Ambulatory Visit (HOSPITAL_COMMUNITY)
Admission: EM | Admit: 2017-11-04 | Discharge: 2017-11-04 | Disposition: A | Discharge status: Home / Self Care | Payer: Medicare Other | Attending: Family Medicine | Admitting: Family Medicine

## 2017-11-04 ENCOUNTER — Encounter (HOSPITAL_COMMUNITY): Payer: Self-pay | Admitting: Family Medicine

## 2017-11-04 DIAGNOSIS — M79602 Pain in left arm: Secondary | ICD-10-CM | POA: Diagnosis not present

## 2017-11-04 MED ORDER — METHYLPREDNISOLONE 4 MG PO TABS: 4.0000 mg | ORAL_TABLET | Freq: Two times a day (BID) | ORAL | 0 refills | Status: DC

## 2017-11-04 NOTE — ED Provider Notes (Signed)
[facility]ARE [facility]ENTER   [id number] 11/04/17 Arrival Time: 1350   SUBJECTIVE:  Daniel Porter is a 77 y.o. male who presents to the urgent care with complaint of soreness in the left shoulder and arm. Patient attributes this to an IV that was placed in his left dorsal hand many months ago. He comes to the clinic repeatedly for attention to this.  Past Medical History:  Diagnosis Date  . Constipation   . Diabetes mellitus without complication (HCC)   . GERD (gastroesophageal reflux disease)   . Hyperlipidemia   . Hypertension   . Initial insomnia   . Stroke (HCC)   . Umbilical hernia    History reviewed. No pertinent family history. Social History   Socioeconomic History  . Marital status: Married    Spouse name: Not on file  . Number of children: Not on file  . Years of education: Not on file  . Highest education level: Not on file  Occupational History  . Not on file  Social Needs  . Financial resource strain: Not on file  . Food insecurity:    Worry: Not on file    Inability: Not on file  . Transportation needs:    Medical: Not on file    Non-medical: Not on file  Tobacco Use  . Smoking status: Never Smoker  . Smokeless tobacco: Never Used  Substance and Sexual Activity  . Alcohol use: No  . Drug use: No  . Sexual activity: Not on file  Lifestyle  . Physical activity:    Days per week: Not on file    Minutes per session: Not on file  . Stress: Not on file  Relationships  . Social connections:    Talks on phone: Not on file    Gets together: Not on file    Attends religious service: Not on file    Active member of club or organization: Not on file    Attends meetings of clubs or organizations: Not on file    Relationship status: Not on file  . Intimate partner violence:    Fear of current or ex partner: Not on file    Emotionally abused: Not on file    Physically abused: Not on file    Forced sexual activity: Not on file  Other Topics Concern  .  Not on file  Social History Narrative  . Not on file   No outpatient medications have been marked as taking for the 11/04/17 encounter [facility] Encounter).   Allergies  Allergen Reactions  . Pork-Derived Products Other (See Comments)    Pt doesn't consume any pork products      ROS: As per HPI, remainder of ROS negative.   OBJECTIVE:   Vitals:   11/04/17 1444  BP: (!) 159/104  Pulse: 71  Resp: 20  Temp: 97.9 F (36.6 C)  TempSrc: Oral  SpO2: 97%     General appearance: alert; no distress Eyes: PERRL; EOMI; conjunctiva normal HENT: normocephalic; atraumatic; TMs normal, canal normal, external ears normal without trauma; nasal mucosa normal; oral mucosa normal Neck: supple Lungs: clear to auscultation bilaterally Heart: regular rate and rhythm Back: no CVA tenderness Extremities: no cyanosis or edema; symmetrical with no gross deformities; good range of motion of left shoulder; left elbow, and left hand. No deformity of any of these is present. Skin: warm and dry Neurologic: normal gait; grossly normal Psychological: alert and cooperative; normal mood and affect      Labs:  Results for orders  placed or performed during the hospital encounter of 08/21/17  Urine culture  Result Value Ref Range   Specimen Description URINE, RANDOM    Special Requests NONE    Culture MULTIPLE SPECIES PRESENT, SUGGEST RECOLLECTION (A)    Report Status 08/23/2017 FINAL   POCT urinalysis dip (device)  Result Value Ref Range   Glucose, UA 500 (A) NEGATIVE mg/dL   Bilirubin Urine NEGATIVE NEGATIVE   Ketones, ur NEGATIVE NEGATIVE mg/dL   Specific Gravity, Urine 1.015 1.005 - 1.030   Hgb urine dipstick LARGE (A) NEGATIVE   pH 6.0 5.0 - 8.0   Protein, ur 100 (A) NEGATIVE mg/dL   Urobilinogen, UA 0.2 0.0 - 1.0 mg/dL   Nitrite NEGATIVE NEGATIVE   Leukocytes, UA SMALL (A) NEGATIVE    Labs Reviewed - No data to display  No results found.     ASSESSMENT & PLAN:  1. Left arm  pain     Meds ordered this encounter  Medications  . DISCONTD: methylPREDNISolone (MEDROL) 4 MG tablet    Sig: Take 1 tablet (4 mg total) by mouth 2 (two) times daily.    Dispense:  10 tablet    Refill:  0  . methylPREDNISolone (MEDROL) 4 MG tablet    Sig: Take 1 tablet (4 mg total) by mouth 2 (two) times daily.    Dispense:  10 tablet    Refill:  0    Reviewed expectations re: course of current medical issues. Questions answered. Outlined signs and symptoms indicating need for more acute intervention. Patient verbalized understanding. After Visit Summary given.    Procedures:      Elvina SidleLauenstein, Tenasia Aull, MD 11/04/17 1451

## 2017-11-11 ENCOUNTER — Ambulatory Visit (HOSPITAL_COMMUNITY)
Admission: EM | Admit: 2017-11-11 | Discharge: 2017-11-11 | Disposition: A | Discharge status: Home / Self Care | Payer: Medicare Other | Attending: Family Medicine | Admitting: Family Medicine

## 2017-11-11 DIAGNOSIS — M25512 Pain in left shoulder: Secondary | ICD-10-CM

## 2017-11-11 MED ORDER — PREDNISONE 10 MG (48) PO TBPK: ORAL_TABLET | ORAL | 0 refills | Status: DC

## 2017-11-11 MED ORDER — SALIVASURE MT LOZG: 1.0000 | LOZENGE | Freq: Two times a day (BID) | OROMUCOSAL | 0 refills | Status: AC | PRN

## 2017-11-16 NOTE — ED Provider Notes (Signed)
[facility]   [id number] 11/11/17 Arrival Time: 1949  ASSESSMENT & PLAN:  1. Pain in joint of left shoulder     Meds ordered this encounter  Medications  . Artificial Saliva (SALIVASURE) LOZG    Sig: Use as directed 1 lozenge in the mouth or throat 3 times/day as needed-between meals & bedtime.    Dispense:  90 lozenge    Refill:  0  . predniSONE (STERAPRED UNI-PAK 48 TAB) 10 MG (48) TBPK tablet    Sig: Take as directed.    Dispense:  48 tablet    Refill:  0   I stressed again that he may benefit from physical therapy. Information given for him to call and schedule.  Reviewed expectations re: course of current medical issues. Questions answered. Outlined signs and symptoms indicating need for more acute intervention. Patient verbalized understanding. After Visit Summary given.  SUBJECTIVE: History from: patient. Daniel Porter is a 77 y.o. male who reports continued L arm/shoulder soreness. He has been to see us many times for this. Sometimes with temporary relief but it always returns. No worsening. He cannot identify triggers that make this worse. No extremity sensation changes or weakness.  Occasional OTC with mild help. Current exacerbation for a few days. No new injury. No specific aggravating or alleviating factors reported.  ROS: As per HPI.   OBJECTIVE:  Vitals:   11/11/17 2049  BP: (!) 166/104  Pulse: 83  Resp: 20  Temp: (!) 97.5 F (36.4 C)  TempSrc: Oral  SpO2: 96%    General appearance: alert; no distress Extremities: no cyanosis or edema; symmetrical with no gross deformities; continues to reports generalized soreness around proximal LUE; FROM; normal strength CV: normal extremity capillary refill Skin: warm and dry Neurologic: normal gait; normal symmetric reflexes in all extremities; normal sensation in all extremities Psychological: alert and cooperative; normal mood and affect  Allergies  Allergen Reactions  . Pork-Derived  Products Other (See Comments)    Pt doesn't consume any pork products    Past Medical History:  Diagnosis Date  . Constipation   . Diabetes mellitus without complication (HCC)   . GERD (gastroesophageal reflux disease)   . Hyperlipidemia   . Hypertension   . Initial insomnia   . Stroke (HCC)   . Umbilical hernia    Social History   Socioeconomic History  . Marital status: Married    Spouse name: Not on file  . Number of children: Not on file  . Years of education: Not on file  . Highest education level: Not on file  Occupational History  . Not on file  Social Needs  . Financial resource strain: Not on file  . Food insecurity:    Worry: Not on file    Inability: Not on file  . Transportation needs:    Medical: Not on file    Non-medical: Not on file  Tobacco Use  . Smoking status: Never Smoker  . Smokeless tobacco: Never Used  Substance and Sexual Activity  . Alcohol use: No  . Drug use: No  . Sexual activity: Not on file  Lifestyle  . Physical activity:    Days per week: Not on file    Minutes per session: Not on file  . Stress: Not on file  Relationships  . Social connections:    Talks on phone: Not on file    Gets together: Not on file    Attends religious service: Not on file    Active  member of club or organization: Not on file    Attends meetings of clubs or organizations: Not on file    Relationship status: Not on file  . Intimate partner violence:    Fear of current or ex partner: Not on file    Emotionally abused: Not on file    Physically abused: Not on file    Forced sexual activity: Not on file  Other Topics Concern  . Not on file  Social History Narrative  . Not on file   No family history on file. Past Surgical History:  Procedure Laterality Date  . HERNIA REPAIR     2  . UMBILICAL HERNIA REPAIR  11/22/2011   Procedure: HERNIA REPAIR UMBILICAL ADULT;  Surgeon: Liz Malady, MD;  Location: [facility] OR;  Service: General;  Laterality: Vertis Kelch, MD 11/16/17 [phone]

## 2017-11-23 ENCOUNTER — Other Ambulatory Visit: Payer: Self-pay

## 2017-11-23 ENCOUNTER — Encounter (HOSPITAL_COMMUNITY): Payer: Self-pay | Admitting: Emergency Medicine

## 2017-11-23 ENCOUNTER — Ambulatory Visit (HOSPITAL_COMMUNITY)
Admission: EM | Admit: 2017-11-23 | Discharge: 2017-11-23 | Disposition: A | Discharge status: Home / Self Care | Payer: Medicare Other | Attending: Internal Medicine | Admitting: Internal Medicine

## 2017-11-23 DIAGNOSIS — M79642 Pain in left hand: Secondary | ICD-10-CM | POA: Diagnosis not present

## 2017-11-23 MED ORDER — MELOXICAM 7.5 MG PO TABS: 7.5000 mg | ORAL_TABLET | Freq: Every day | ORAL | 0 refills | Status: DC

## 2017-11-23 MED ORDER — DICLOFENAC SODIUM 1 % TD GEL: 2.0000 g | Freq: Four times a day (QID) | TRANSDERMAL | 0 refills | Status: DC

## 2017-11-23 NOTE — Discharge Instructions (Addendum)
Start mobic for pain. Can use voltaren gel to help with the pain as well. Follow up with PCP for further evaluation needed.

## 2017-11-23 NOTE — ED Triage Notes (Signed)
Abdominal pain and burning in left hand.    No vomiting, no diarrhea.    Urinating more frequently

## 2017-11-24 NOTE — ED Provider Notes (Signed)
[facility]    CSN: [id number] Arrival date & time: 11/23/17  1616     History   Chief Complaint Chief Complaint  Patient presents with  . Abdominal Pain    HPI Daniel Porter is a 77 y.o. male.   77 year old male comes in for abdominal pain and left hand pain that are both chronic.  He states he comes here periodically to "get checked" and make sure he is doing well.  He does not want to follow-up with his PCP as he "cannot trust them".  He is eating and drinking without problems, denies nausea, vomiting.  He has a history of constipation, and has not been moving his bowels as often, also stopped his MiraLAX.  He contributes his left hand pain due to IV insertion in the past.  States the pain is on the dorsal aspect of the hand.  Denies erythema, increased warmth.  Taken prednisone in the past with some relief.  He is a states his urine is "white".  States it is clear and not yellow.  Denies urinary frequency, dysuria, hematuria.     Past Medical History:  Diagnosis Date  . Constipation   . Diabetes mellitus without complication (HCC)   . GERD (gastroesophageal reflux disease)   . Hyperlipidemia   . Hypertension   . Initial insomnia   . Stroke (HCC)   . Umbilical hernia     Patient Active Problem List   Diagnosis Date Noted  . DM2 (diabetes mellitus, type 2) (HCC) 12/02/2016  . Sepsis secondary to UTI (HCC) 12/02/2016  . Pyohydronephrosis 12/02/2016  . Bladder mass 12/02/2016  . CKD (chronic kidney disease) stage 3, GFR 30-59 ml/min (HCC) 12/02/2016  . Recurrent umbilical hernia 10/17/2012  . S/P umbilical hernia repair, follow-up exam 11/30/2011  . Umbilical hernia 10/12/2011  . Right inguinal hernia 10/12/2011    Past Surgical History:  Procedure Laterality Date  . HERNIA REPAIR     2  . UMBILICAL HERNIA REPAIR  11/22/2011   Procedure: HERNIA REPAIR UMBILICAL ADULT;  Surgeon: Liz MaladyBurke E Thompson, MD;  Location[facility];  Service: General;  Laterality:  N/A;       Home Medications    Prior to Admission medications   Medication Sig Start Date End Date Taking? Authorizing Provider  acetaminophen (TYLENOL) 500 MG tablet Take 2 tablets (1,000 mg total) by mouth every 6 (six) hours as needed. 08/09/17   Lawyer, Cristal Deerhristopher, PA-C  amLODipine (NORVASC) 5 MG tablet Take 1 tablet (5 mg total) by mouth daily. 01/17/17   Elson AreasSofia, Leslie K, PA-C  Artificial Saliva [facility](SALIVASURE) LOZG Use as directed 1 lozenge in the mouth or throat 3 times/day as needed-between meals & bedtime. 11/11/17   Mardella LaymanHagler, Brian, MD  diclofenac sodium (VOLTAREN) 1 % GEL Apply 2 g topically 4 (four) times daily. 11/23/17   Cathie HoopsYu, Talise Sligh V, PA-C  docusate sodium (COLACE) 100 MG capsule Take 100 mg by mouth 2 (two) times daily.    [provider]  finasteride (PROSCAR) 5 MG tablet Take 1 tablet (5 mg total) by mouth daily. 01/17/17   Elson AreasSofia, Leslie K, PA-C  glimepiride (AMARYL) 2 MG tablet Take 1 tablet (2 mg total) by mouth 2 (two) times daily with a meal. 08/21/17   Wallis BambergMani, Mario, PA-C  lactulose (CHRONULAC) 10 GM/15ML solution Take 15 mLs (10 g total) by mouth 2 (two) times daily as needed for mild constipation. 09/26/17   Wieters, Hallie C, PA-C  linaclotide (LINZESS) 72 MCG capsule Take  1 capsule (72 mcg total) by mouth daily before breakfast. 09/10/17 12/09/17  Lucia Estelle, NP  meloxicam (MOBIC) 7.5 MG tablet Take 1 tablet (7.5 mg total) by mouth daily. 11/23/17   Cathie Hoops, Kashaun Bebo V, PA-C  methylPREDNISolone (MEDROL) 4 MG tablet Take 1 tablet (4 mg total) by mouth 2 (two) times daily. 11/04/17   Elvina Sidle, MD  predniSONE (STERAPRED UNI-PAK 48 TAB) 10 MG (48) TBPK tablet Take as directed. 11/11/17   Mardella Layman, MD  tamsulosin (FLOMAX) 0.4 MG CAPS capsule Take 1 capsule (0.4 mg total) by mouth daily after supper. 01/17/17   Elson Areas, PA-C  traMADol (ULTRAM) 50 MG tablet Take 1 tablet (50 mg total) by mouth every 6 (six) hours as needed. 10/18/17   Mardella Layman, MD    Family  History History reviewed. No pertinent family history.  Social History Social History   Tobacco Use  . Smoking status: Never Smoker  . Smokeless tobacco: Never Used  Substance Use Topics  . Alcohol use: No  . Drug use: No     Allergies   Pork-derived products   Review of Systems Review of Systems  Reason unable to perform ROS: See HPI as above.     Physical Exam Triage Vital Signs ED Triage Vitals  Enc Vitals Group     BP 11/23/17 1721 (!) 155/90     Pulse Rate 11/23/17 1721 79     Resp 11/23/17 1721 18     Temp 11/23/17 1721 (!) 97.2 F (36.2 C)     Temp Source 11/23/17 1721 Oral     SpO2 11/23/17 1721 97 %     Weight --      Height --      Head Circumference --      Peak Flow --      Pain Score 11/23/17 1720 7     Pain Loc --      Pain Edu? --      Excl. in GC? --    No data found.  Updated Vital Signs BP (!) 155/90 (BP Location: Left Arm)   Pulse 79   Temp (!) 97.2 F (36.2 C) (Oral)   Resp 18   SpO2 97%   Physical Exam  Constitutional: He is oriented to person, place, and time. He appears well-developed and well-nourished. No distress.  HENT:  Head: Normocephalic and atraumatic.  Cardiovascular: Normal rate, regular rhythm and normal heart sounds. Exam reveals no gallop and no friction rub.  No murmur heard. Pulmonary/Chest: Effort normal and breath sounds normal. He has no wheezes. He has no rales.  Abdominal: Soft. Bowel sounds are normal. He exhibits no mass. There is no tenderness. There is no rebound and no guarding.  Musculoskeletal:  No swelling, erythema, increased warmth, contusion, wound seen.  No tenderness to palpation.  Full range of motion.  Neurological: He is alert and oriented to person, place, and time.  Skin: Skin is warm and dry.  Psychiatric: He has a normal mood and affect. His behavior is normal. Judgment normal.     UC Treatments / Results  Labs (all labs ordered are listed, but only abnormal results are  displayed) Labs Reviewed - No data to display  EKG None Radiology No results found.  Procedures Procedures (including critical care time)  Medications Ordered in UC Medications - No data to display   Initial Impression / Assessment and Plan / UC Course  I have reviewed the triage vital signs and the nursing notes.  Pertinent labs & imaging results that were available during my care of the patient were reviewed by me and considered in my medical decision making (see chart for details).    No alarming signs on exam. Patient nontoxic in appearance, sitting and talking without distress. Ambulates on own and moves left hand freely without problems. Patient with history of DM and has been on prednisone recently, will try mobic and voltaren gel instead. Encouraged follow up with PCP for management of chronic diseases.   Final Clinical Impressions(s) / UC Diagnoses   Final diagnoses:  Left hand pain    ED Discharge Orders        Ordered    meloxicam (MOBIC) 7.5 MG tablet  Daily     11/23/17 1810    diclofenac sodium (VOLTAREN) 1 % GEL  4 times daily     11/23/17 1811        Belinda Fisher, PA-C 11/24/17 1005

## 2017-12-20 ENCOUNTER — Encounter (HOSPITAL_COMMUNITY): Payer: Self-pay

## 2017-12-20 ENCOUNTER — Ambulatory Visit (HOSPITAL_COMMUNITY)
Admission: EM | Admit: 2017-12-20 | Discharge: 2017-12-20 | Disposition: A | Discharge status: Home / Self Care | Payer: Medicare Other | Attending: Family Medicine | Admitting: Family Medicine

## 2017-12-20 DIAGNOSIS — R32 Unspecified urinary incontinence: Secondary | ICD-10-CM

## 2017-12-20 MED ORDER — TOLTERODINE TARTRATE ER 4 MG PO CP24: 4.0000 mg | ORAL_CAPSULE | Freq: Every day | ORAL | 1 refills | Status: DC

## 2017-12-20 NOTE — ED Triage Notes (Signed)
Pt presents with abdominal pain x 2 days

## 2017-12-22 ENCOUNTER — Telehealth (HOSPITAL_COMMUNITY): Payer: Self-pay | Admitting: Family Medicine

## 2017-12-22 MED ORDER — TOLTERODINE TARTRATE ER 4 MG PO CP24: 4.0000 mg | ORAL_CAPSULE | Freq: Every day | ORAL | 1 refills | Status: DC

## 2017-12-22 NOTE — Telephone Encounter (Signed)
Medication sent to pharmacy per pt request.

## 2017-12-28 ENCOUNTER — Encounter (HOSPITAL_COMMUNITY): Payer: Self-pay | Admitting: Family Medicine

## 2017-12-28 ENCOUNTER — Other Ambulatory Visit: Payer: Self-pay

## 2017-12-28 ENCOUNTER — Ambulatory Visit (HOSPITAL_COMMUNITY)
Admission: EM | Admit: 2017-12-28 | Discharge: 2017-12-28 | Disposition: A | Discharge status: Home / Self Care | Payer: Medicare Other | Attending: Family Medicine | Admitting: Family Medicine

## 2017-12-28 DIAGNOSIS — R42 Dizziness and giddiness: Secondary | ICD-10-CM | POA: Diagnosis not present

## 2017-12-28 DIAGNOSIS — E119 Type 2 diabetes mellitus without complications: Secondary | ICD-10-CM | POA: Diagnosis not present

## 2017-12-28 LAB — GLUCOSE, CAPILLARY: GLUCOSE-CAPILLARY: 400 mg/dL — AB (ref 65–99)

## 2017-12-28 MED ORDER — METFORMIN HCL ER 500 MG PO TB24: 500.0000 mg | ORAL_TABLET | Freq: Every day | ORAL | 0 refills | Status: AC

## 2017-12-28 NOTE — ED Provider Notes (Signed)
[facility]ARE CENTER   [id number] 12/28/17 Arrival Time: 1528   SUBJECTIVE:  Daniel Porter is a 77 y.o. male who presents to the urgent care with complaint of uncertain blood sugar level.  He has been to his PCP at [facility], but they did not share the glucose level with him.   He was dizzy earlier and ate a biscuit, now feels better.  Currently not taking anything for his diabetes.   Past Medical History:  Diagnosis Date  . Constipation   . Diabetes mellitus without complication (HCC)   . GERD (gastroesophageal reflux disease)   . Hyperlipidemia   . Hypertension   . Initial insomnia   . Stroke (HCC)   . Umbilical hernia    History reviewed. No pertinent family history. Social History   Socioeconomic History  . Marital status: Married    Spouse name: Not on file  . Number of children: Not on file  . Years of education: Not on file  . Highest education level: Not on file  Occupational History  . Not on file  Social Needs  . Financial resource strain: Not on file  . Food insecurity:    Worry: Not on file    Inability: Not on file  . Transportation needs:    Medical: Not on file    Non-medical: Not on file  Tobacco Use  . Smoking status: Never Smoker  . Smokeless tobacco: Never Used  Substance and Sexual Activity  . Alcohol use: No  . Drug use: No  . Sexual activity: Not on file  Lifestyle  . Physical activity:    Days per week: Not on file    Minutes per session: Not on file  . Stress: Not on file  Relationships  . Social connections:    Talks on phone: Not on file    Gets together: Not on file    Attends religious service: Not on file    Active member of club or organization: Not on file    Attends meetings of clubs or organizations: Not on file    Relationship status: Not on file  . Intimate partner violence:    Fear of current or ex partner: Not on file    Emotionally abused: Not on file    Physically abused: Not on file    Forced  sexual activity: Not on file  Other Topics Concern  . Not on file  Social History Narrative  . Not on file   No outpatient medications have been marked as taking for the 12/28/17 encounter [facility] Encounter).   Allergies  Allergen Reactions  . Pork-Derived Products Other (See Comments)    Pt doesn't consume any pork products      ROS: As per HPI, remainder of ROS negative.   OBJECTIVE:   Vitals:   12/28/17 1543  BP: 135/80  Pulse: 80  Temp: (!) 97.2 F (36.2 C)  TempSrc: Oral  SpO2: 96%     General appearance: alert; no distress Eyes: PERRL; EOMI; conjunctiva normal HENT: normocephalic; atraumatic;  oral mucosa dry mouth Neck: supple Back: no CVA tenderness Extremities: no cyanosis or edema; symmetrical with no gross deformities Skin: warm and dry Neurologic: normal gait; grossly normal Psychological: alert and cooperative; normal mood and affect      Labs:  Results for orders placed or performed during the hospital encounter of 12/28/17  Glucose, capillary  Result Value Ref Range   Glucose-Capillary 400 (H) 65 - 99 mg/dL    Labs Reviewed  GLUCOSE, CAPILLARY - Abnormal; Notable for the following components:      Result Value   Glucose-Capillary 400 (*)    All other components within normal limits    No results found.     ASSESSMENT & PLAN:  1. Type 2 diabetes mellitus without complication, without long-term current use of insulin (HCC)   Return in 1 week to check the blood.  Meds ordered this encounter  Medications  . metFORMIN (GLUCOPHAGE XR) 500 MG 24 hr tablet    Sig: Take 1 tablet (500 mg total) by mouth daily with breakfast.    Dispense:  14 tablet    Refill:  0    Reviewed expectations re: course of current medical issues. Questions answered. Outlined signs and symptoms indicating need for more acute intervention. Patient verbalized understanding. After Visit Summary given.    Procedures:      Elvina Sidle,  MD 12/28/17 1559

## 2017-12-28 NOTE — ED Triage Notes (Signed)
Pt here with grandson.  States he hasn't checked his BS in 2 days.

## 2017-12-28 NOTE — ED Provider Notes (Signed)
[facility]   [id number] 12/20/17 Arrival Time: 1447  ASSESSMENT & PLAN:  1. Urinary incontinence, unspecified type    Trial of: Meds ordered this encounter  Medications  . tolterodine (DETROL LA) 4 MG 24 hr capsule    Sig: Take 1 capsule (4 mg total) by mouth daily.    Dispense:  30 capsule    Refill:  1   Recommend that he f/u with his PCP or urology for futher evaluation, esp if medicine does not help. May also try frequent bladder emptying.  Reviewed expectations re: course of current medical issues. Questions answered. Outlined signs and symptoms indicating need for more acute intervention. Patient verbalized understanding. After Visit Summary given.   SUBJECTIVE:  Daniel Porter is a 77 y.o. male who presents with complaint of intermittent urinary incontinence. Has had in the past off and on. Has noticed more frequently recently. No change in medications or lifestyle. Describes urge to urinate and will leak urine before he can make it to the toilet. No hematuria. No urinary frequency or dysuria. No abdominal symptoms. No specific aggravating or alleviating factors reported. No OTC treatment. Normal bowel movements.  Past Surgical History:  Procedure Laterality Date  . HERNIA REPAIR     2  . UMBILICAL HERNIA REPAIR  11/22/2011   Procedure: HERNIA REPAIR UMBILICAL ADULT;  Surgeon: Liz Malady, MD;  Location: [facility] OR;  Service: General;  Laterality: N/A;    ROS: As per HPI.  OBJECTIVE:  Vitals:   12/20/17 1506  BP: 137/71  Pulse: 76  Resp: 18  Temp: 98 F (36.7 C)  SpO2: 100%    General appearance: alert; no distress Lungs: clear to auscultation bilaterally Heart: regular rate and rhythm Abdomen: soft; non-distended; no tenderness; bowel sounds present; no masses or organomegaly; no guarding or rebound tenderness Back: no CVA tenderness; FROM at hips Extremities: no edema; symmetrical with no gross deformities Skin: warm and dry Psychological:  alert and cooperative; normal mood and affect   Allergies  Allergen Reactions  . Pork-Derived Products Other (See Comments)    Pt doesn't consume any pork products                                               Past Medical History:  Diagnosis Date  . Constipation   . Diabetes mellitus without complication (HCC)   . GERD (gastroesophageal reflux disease)   . Hyperlipidemia   . Hypertension   . Initial insomnia   . Stroke (HCC)   . Umbilical hernia    Social History   Socioeconomic History  . Marital status: Married    Spouse name: Not on file  . Number of children: Not on file  . Years of education: Not on file  . Highest education level: Not on file  Occupational History  . Not on file  Social Needs  . Financial resource strain: Not on file  . Food insecurity:    Worry: Not on file    Inability: Not on file  . Transportation needs:    Medical: Not on file    Non-medical: Not on file  Tobacco Use  . Smoking status: Never Smoker  . Smokeless tobacco: Never Used  Substance and Sexual Activity  . Alcohol use: No  . Drug use: No  . Sexual activity: Not on file  Lifestyle  . Physical  activity:    Days per week: Not on file    Minutes per session: Not on file  . Stress: Not on file  Relationships  . Social connections:    Talks on phone: Not on file    Gets together: Not on file    Attends religious service: Not on file    Active member of club or organization: Not on file    Attends meetings of clubs or organizations: Not on file    Relationship status: Not on file  . Intimate partner violence:    Fear of current or ex partner: Not on file    Emotionally abused: Not on file    Physically abused: Not on file    Forced sexual activity: Not on file  Other Topics Concern  . Not on file  Social History Narrative  . Not on file   No family history on file.   Mardella Layman, MD 12/28/17 [phone]

## 2017-12-28 NOTE — Discharge Instructions (Addendum)
Return in 1 week to check the blood.

## 2018-01-14 ENCOUNTER — Ambulatory Visit (HOSPITAL_COMMUNITY)
Admission: EM | Admit: 2018-01-14 | Discharge: 2018-01-14 | Disposition: A | Discharge status: Home / Self Care | Payer: Medicare Other | Attending: Family Medicine | Admitting: Family Medicine

## 2018-01-14 ENCOUNTER — Other Ambulatory Visit: Payer: Self-pay

## 2018-01-14 ENCOUNTER — Encounter (HOSPITAL_COMMUNITY): Payer: Self-pay | Admitting: Emergency Medicine

## 2018-01-14 DIAGNOSIS — Z9109 Other allergy status, other than to drugs and biological substances: Secondary | ICD-10-CM

## 2018-01-14 DIAGNOSIS — B9789 Other viral agents as the cause of diseases classified elsewhere: Secondary | ICD-10-CM

## 2018-01-14 DIAGNOSIS — R05 Cough: Secondary | ICD-10-CM

## 2018-01-14 DIAGNOSIS — J069 Acute upper respiratory infection, unspecified: Secondary | ICD-10-CM

## 2018-01-14 DIAGNOSIS — R0981 Nasal congestion: Secondary | ICD-10-CM

## 2018-01-14 DIAGNOSIS — R059 Cough, unspecified: Secondary | ICD-10-CM

## 2018-01-14 DIAGNOSIS — R070 Pain in throat: Secondary | ICD-10-CM

## 2018-01-14 DIAGNOSIS — R0989 Other specified symptoms and signs involving the circulatory and respiratory systems: Secondary | ICD-10-CM

## 2018-01-14 MED ORDER — ACETAMINOPHEN-CODEINE 120-12 MG/5ML PO SUSP: 10.0000 mL | Freq: Every evening | ORAL | 0 refills | Status: DC | PRN

## 2018-01-14 MED ORDER — ACETAMINOPHEN-CODEINE 120-12 MG/5ML PO SUSP: 10.0000 mL | Freq: Every evening | ORAL | 0 refills | Status: AC | PRN

## 2018-01-14 MED ORDER — BENZONATATE 100 MG PO CAPS: 100.0000 mg | ORAL_CAPSULE | Freq: Three times a day (TID) | ORAL | 0 refills | Status: DC | PRN

## 2018-01-14 MED ORDER — CETIRIZINE HCL 10 MG PO TABS: 10.0000 mg | ORAL_TABLET | Freq: Every day | ORAL | 11 refills | Status: AC

## 2018-01-14 NOTE — ED Triage Notes (Signed)
Patient has a cough for 6 days ago.  Denies sore throat, denies ear pain

## 2018-01-14 NOTE — Discharge Instructions (Signed)
Hydrate well with at least 2 liters (1 gallon) of water daily. For sore throat try using a honey-based tea. Use 3 teaspoons of honey with juice squeezed from half lemon. Place shaved pieces of ginger into 1/2-1 cup of water and warm over stove top. Then mix the ingredients and repeat every 4 hours as needed.  °

## 2018-01-14 NOTE — ED Provider Notes (Signed)
MRN: [id number] DOB: Aug 02, 1941  Subjective:   Daniel Porter is a 77 y.o. male presenting for 6 day history of sore throat, productive cough, bilateral ear pain, runny nose. Denies fever, chest pain, shob. Denies smoking cigarettes.  Patient reports that he has a persistently runny nose but does not take any allergy medicines.  He has not tried any medications for relief.  Patient has uncontrolled diabetes and is noncompliant with his diet and medications.  No current facility-administered medications for this encounter.   Current Outpatient Medications:  .  acetaminophen (TYLENOL) 500 MG tablet, Take 2 tablets (1,000 mg total) by mouth every 6 (six) hours as needed., Disp: 30 tablet, Rfl: 0 .  amLODipine (NORVASC) 5 MG tablet, Take 1 tablet (5 mg total) by mouth daily., Disp: 30 tablet, Rfl: 1 .  Artificial Saliva (SALIVASURE) LOZG, Use as directed 1 lozenge in the mouth or throat 3 times/day as needed-between meals & bedtime., Disp: 90 lozenge, Rfl: 0 .  diclofenac sodium (VOLTAREN) 1 % GEL, Apply 2 g topically 4 (four) times daily., Disp: 1 Tube, Rfl: 0 .  docusate sodium (COLACE) 100 MG capsule, Take 100 mg by mouth 2 (two) times daily., Disp: , Rfl:  .  finasteride (PROSCAR) 5 MG tablet, Take 1 tablet (5 mg total) by mouth daily., Disp: 30 tablet, Rfl: 1 .  lactulose (CHRONULAC) 10 GM/15ML solution, Take 15 mLs (10 g total) by mouth 2 (two) times daily as needed for mild constipation., Disp: 300 mL, Rfl: 0 .  linaclotide (LINZESS) 72 MCG capsule, Take 1 capsule (72 mcg total) by mouth daily before breakfast., Disp: 90 capsule, Rfl: 0 .  metFORMIN (GLUCOPHAGE XR) 500 MG 24 hr tablet, Take 1 tablet (500 mg total) by mouth daily with breakfast., Disp: 14 tablet, Rfl: 0 .  tamsulosin (FLOMAX) 0.4 MG CAPS capsule, Take 1 capsule (0.4 mg total) by mouth daily after supper., Disp: 30 capsule, Rfl: 1 .  traMADol (ULTRAM) 50 MG tablet, Take 1 tablet (50 mg total) by mouth every 6 (six) hours as  needed., Disp: 15 tablet, Rfl: 0   Allergies  Allergen Reactions  . Pork-Derived Products Other (See Comments)    Pt doesn't consume any pork products    Past Medical History:  Diagnosis Date  . Constipation   . Diabetes mellitus without complication (HCC)   . GERD (gastroesophageal reflux disease)   . Hyperlipidemia   . Hypertension   . Initial insomnia   . Stroke (HCC)   . Umbilical hernia      Past Surgical History:  Procedure Laterality Date  . HERNIA REPAIR     2  . UMBILICAL HERNIA REPAIR  11/22/2011   Procedure: HERNIA REPAIR UMBILICAL ADULT;  Surgeon: Liz Malady, MD;  Location: [facility] OR;  Service: General;  Laterality: N/A;    Objective:   Vitals: BP (!) 144/88 (BP Location: Left Arm)   Pulse 84   Temp 98.2 F (36.8 C) (Oral)   Resp 20   SpO2 100%   Physical Exam  Constitutional: He is oriented to person, place, and time. He appears well-developed and well-nourished.  HENT:  Right Ear: Tympanic membrane normal.  Left Ear: Tympanic membrane normal.  Nose: Mucosal edema and rhinorrhea present. No sinus tenderness.  Mouth/Throat: No oropharyngeal exudate, posterior oropharyngeal edema, posterior oropharyngeal erythema or tonsillar abscesses.  Throat does have postnasal drainage.  Eyes: Right eye exhibits no discharge. Left eye exhibits no discharge. No scleral icterus.  Neck: Normal range of motion.  Neck supple.  Cardiovascular: Normal rate, regular rhythm and intact distal pulses. Exam reveals no gallop and no friction rub.  No murmur heard. Pulmonary/Chest: Effort normal. No respiratory distress. He has no wheezes. He has no rales.  Lymphadenopathy:    He has no cervical adenopathy.  Neurological: He is alert and oriented to person, place, and time.  Skin: Skin is warm and dry.  Psychiatric: He has a normal mood and affect.   Assessment and Plan :   Cough  Sinus congestion  Runny nose  Throat pain  Viral URI with cough  Environmental  allergies  Recommend supportive care for what I suspect is a viral illness.  Patient reports that his son has also been ill with similar symptoms but is not doing better.  Recommend patient also use Zyrtec to address allergies as a source of his symptoms.  Return to clinic precautions discussed.   Wallis BambergMani, Denelda Akerley, [place] JerseyPA-C 01/14/18 2103

## 2018-02-02 ENCOUNTER — Encounter (HOSPITAL_COMMUNITY): Payer: Self-pay | Admitting: *Deleted

## 2018-02-02 ENCOUNTER — Other Ambulatory Visit: Payer: Self-pay

## 2018-02-02 ENCOUNTER — Ambulatory Visit (HOSPITAL_COMMUNITY)
Admission: EM | Admit: 2018-02-02 | Discharge: 2018-02-02 | Discharge status: Against Medical Advice | Payer: Medicare Other

## 2018-02-02 NOTE — ED Triage Notes (Signed)
C/O constipation x 10 days; has some small BMs, but not significant ones.  Has not tried any meds.

## 2018-02-02 NOTE — ED Notes (Signed)
Per registration, pt stated he was leaving because [facility] called.

## 2021-02-20 ENCOUNTER — Emergency Department: Payer: Medicare Other

## 2021-02-20 ENCOUNTER — Other Ambulatory Visit: Payer: Self-pay

## 2021-02-20 ENCOUNTER — Emergency Department
Admission: EM | Admit: 2021-02-20 | Discharge: 2021-02-21 | Disposition: A | Discharge status: Home / Self Care | Payer: Medicare Other | Attending: Emergency Medicine | Admitting: Emergency Medicine

## 2021-02-20 DIAGNOSIS — N3090 Cystitis, unspecified without hematuria: Secondary | ICD-10-CM | POA: Diagnosis not present

## 2021-02-20 DIAGNOSIS — I129 Hypertensive chronic kidney disease with stage 1 through stage 4 chronic kidney disease, or unspecified chronic kidney disease: Secondary | ICD-10-CM | POA: Diagnosis not present

## 2021-02-20 DIAGNOSIS — Z79899 Other long term (current) drug therapy: Secondary | ICD-10-CM | POA: Diagnosis not present

## 2021-02-20 DIAGNOSIS — E785 Hyperlipidemia, unspecified: Secondary | ICD-10-CM | POA: Insufficient documentation

## 2021-02-20 DIAGNOSIS — N183 Chronic kidney disease, stage 3 unspecified: Secondary | ICD-10-CM | POA: Insufficient documentation

## 2021-02-20 DIAGNOSIS — F03C0 Unspecified dementia, severe, without behavioral disturbance, psychotic disturbance, mood disturbance, and anxiety: Secondary | ICD-10-CM

## 2021-02-20 DIAGNOSIS — F0390 Unspecified dementia without behavioral disturbance: Secondary | ICD-10-CM | POA: Diagnosis not present

## 2021-02-20 DIAGNOSIS — E1169 Type 2 diabetes mellitus with other specified complication: Secondary | ICD-10-CM | POA: Insufficient documentation

## 2021-02-20 DIAGNOSIS — E860 Dehydration: Secondary | ICD-10-CM

## 2021-02-20 DIAGNOSIS — Z20822 Contact with and (suspected) exposure to covid-19: Secondary | ICD-10-CM | POA: Diagnosis not present

## 2021-02-20 DIAGNOSIS — S8001XA Contusion of right knee, initial encounter: Secondary | ICD-10-CM | POA: Diagnosis not present

## 2021-02-20 DIAGNOSIS — E119 Type 2 diabetes mellitus without complications: Secondary | ICD-10-CM

## 2021-02-20 DIAGNOSIS — E1122 Type 2 diabetes mellitus with diabetic chronic kidney disease: Secondary | ICD-10-CM | POA: Insufficient documentation

## 2021-02-20 DIAGNOSIS — Z7984 Long term (current) use of oral hypoglycemic drugs: Secondary | ICD-10-CM | POA: Diagnosis not present

## 2021-02-20 DIAGNOSIS — S8991XA Unspecified injury of right lower leg, initial encounter: Secondary | ICD-10-CM | POA: Diagnosis present

## 2021-02-20 DIAGNOSIS — W19XXXA Unspecified fall, initial encounter: Secondary | ICD-10-CM | POA: Insufficient documentation

## 2021-02-20 DIAGNOSIS — R296 Repeated falls: Secondary | ICD-10-CM

## 2021-02-20 LAB — CBC WITH DIFFERENTIAL/PLATELET
Abs Immature Granulocytes: 0.06 10*3/uL (ref 0.00–0.07)
Basophils Absolute: 0.1 10*3/uL (ref 0.0–0.1)
Basophils Relative: 0 %
Eosinophils Absolute: 0.3 10*3/uL (ref 0.0–0.5)
Eosinophils Relative: 2 %
HCT: 36.8 % — ABNORMAL LOW (ref 39.0–52.0)
Hemoglobin: 11 g/dL — ABNORMAL LOW (ref 13.0–17.0)
Immature Granulocytes: 0 %
Lymphocytes Relative: 14 %
Lymphs Abs: 2.3 10*3/uL (ref 0.7–4.0)
MCH: 27.4 pg (ref 26.0–34.0)
MCHC: 29.9 g/dL — ABNORMAL LOW (ref 30.0–36.0)
MCV: 91.5 fL (ref 80.0–100.0)
Monocytes Absolute: 0.9 10*3/uL (ref 0.1–1.0)
Monocytes Relative: 6 %
Neutro Abs: 12.6 10*3/uL — ABNORMAL HIGH (ref 1.7–7.7)
Neutrophils Relative %: 78 %
Platelets: 513 10*3/uL — ABNORMAL HIGH (ref 150–400)
RBC: 4.02 MIL/uL — ABNORMAL LOW (ref 4.22–5.81)
RDW: 17.6 % — ABNORMAL HIGH (ref 11.5–15.5)
WBC: 16.3 10*3/uL — ABNORMAL HIGH (ref 4.0–10.5)
nRBC: 0 % (ref 0.0–0.2)

## 2021-02-20 LAB — URINALYSIS, COMPLETE (UACMP) WITH MICROSCOPIC
Bacteria, UA: NONE SEEN
Bilirubin Urine: NEGATIVE
Glucose, UA: NEGATIVE mg/dL
Ketones, ur: 5 mg/dL — AB
Nitrite: NEGATIVE
Protein, ur: 100 mg/dL — AB
RBC / HPF: >50 RBC/hpf — ABNORMAL HIGH (ref 0–5)
Specific Gravity, Urine: 1.017 (ref 1.005–1.030)
Squamous Epithelial / HPF: NONE SEEN (ref 0–5)
WBC, UA: >50 WBC/hpf — ABNORMAL HIGH (ref 0–5)
pH: 5 (ref 5.0–8.0)

## 2021-02-20 LAB — COMPREHENSIVE METABOLIC PANEL
ALT: 31 U/L (ref 0–44)
AST: 44 U/L — ABNORMAL HIGH (ref 15–41)
Albumin: 3.1 g/dL — ABNORMAL LOW (ref 3.5–5.0)
Alkaline Phosphatase: 103 U/L (ref 38–126)
Anion gap: 6 (ref 5–15)
BUN: 46 mg/dL — ABNORMAL HIGH (ref 8–23)
CO2: 25 mmol/L (ref 22–32)
Calcium: 11.3 mg/dL — ABNORMAL HIGH (ref 8.9–10.3)
Chloride: 117 mmol/L — ABNORMAL HIGH (ref 98–111)
Creatinine, Ser: 1.74 mg/dL — ABNORMAL HIGH (ref 0.61–1.24)
GFR, Estimated: 39 mL/min — ABNORMAL LOW (ref >60)
Glucose, Bld: 101 mg/dL — ABNORMAL HIGH (ref 70–99)
Potassium: 5.4 mmol/L — ABNORMAL HIGH (ref 3.5–5.1)
Sodium: 148 mmol/L — ABNORMAL HIGH (ref 135–145)
Total Bilirubin: 0.5 mg/dL (ref 0.3–1.2)
Total Protein: 8.2 g/dL — ABNORMAL HIGH (ref 6.5–8.1)

## 2021-02-20 LAB — RESP PANEL BY RT-PCR (FLU A&B, COVID) ARPGX2
Influenza A by PCR: NEGATIVE
Influenza B by PCR: NEGATIVE
SARS Coronavirus 2 by RT PCR: NEGATIVE

## 2021-02-20 LAB — LIPASE, BLOOD: Lipase: 77 U/L — ABNORMAL HIGH (ref 11–51)

## 2021-02-20 MED ORDER — LACTATED RINGERS IV BOLUS
1000.0000 mL | Freq: Once | INTRAVENOUS | Status: AC
  Administered 2021-02-20: 1000 mL via INTRAVENOUS

## 2021-02-20 MED ORDER — SODIUM CHLORIDE 0.9 % IV BOLUS
1000.0000 mL | Freq: Once | INTRAVENOUS | Status: AC
  Administered 2021-02-20: 1000 mL via INTRAVENOUS

## 2021-02-20 MED ORDER — CEPHALEXIN 500 MG PO CAPS
500.0000 mg | ORAL_CAPSULE | Freq: Once | ORAL | Status: AC
  Administered 2021-02-20: 500 mg via ORAL
  Filled 2021-02-20: qty 1

## 2021-02-20 MED ORDER — CEPHALEXIN 500 MG PO CAPS: 500.0000 mg | ORAL_CAPSULE | Freq: Two times a day (BID) | ORAL | 0 refills | Status: AC

## 2021-02-20 NOTE — ED Notes (Signed)
Pt in Ct  

## 2021-02-20 NOTE — ED Provider Notes (Signed)
[facility] Emergency Department Provider Note  ____________________________________________  Time seen: Approximately 6:58 PM  I have reviewed the triage vital signs and the nursing notes.   HISTORY  Chief Complaint Fall    Level 5 Caveat: Portions of the History and Physical including HPI and review of systems are unable to be completely obtained due to patient altered mental status from chronic dementia  Arabic video interpreter used throughout encounter, however patient unable to engage with interpreter.  HPI Daniel Porter is a 80 y.o. male with a history of diabetes GERD hypertension prior stroke and dementia who is sent to the ED from  healthcare due to unwitnessed fall.  There is reported contusion of the right knee.  When asked if he is having any pain right now, patient replies "32."  He is not able to provide any meaningful history.    Past Medical History:  Diagnosis Date   Constipation    Diabetes mellitus without complication (HCC)    GERD (gastroesophageal reflux disease)    Hyperlipidemia    Hypertension    Initial insomnia    Stroke [facility])    Umbilical hernia      Patient Active Problem List   Diagnosis Date Noted   DM2 (diabetes mellitus, type 2) (HCC) 12/02/2016   Sepsis secondary to UTI (HCC) 12/02/2016   Pyohydronephrosis 12/02/2016   Bladder mass 12/02/2016   CKD (chronic kidney disease) stage 3, GFR 30-59 ml/min (HCC) 12/02/2016   Recurrent umbilical hernia 10/17/2012   S/P umbilical hernia repair, follow-up exam 11/30/2011   Umbilical hernia 10/12/2011   Right inguinal hernia 10/12/2011     Past Surgical History:  Procedure Laterality Date   HERNIA REPAIR     2   UMBILICAL HERNIA REPAIR  11/22/2011   Procedure: HERNIA REPAIR UMBILICAL ADULT;  Surgeon: Liz Malady, MD;  Location: [facility] OR;  Service: General;  Laterality: N/A;     Prior to Admission medications   Medication Sig Start Date End Date  Taking? Authorizing Provider  cephALEXin (KEFLEX) 500 MG capsule Take 1 capsule (500 mg total) by mouth 2 (two) times daily. 02/20/21  Yes Sharman Cheek, MD  acetaminophen (TYLENOL) 500 MG tablet Take 2 tablets (1,000 mg total) by mouth every 6 (six) hours as needed. 08/09/17   Lawyer, Cristal Deer, PA-C  acetaminophen-codeine 120-12 MG/5ML suspension Take 10 mLs by mouth at bedtime as needed. 01/14/18   Wallis Bamberg, PA-C  amLODipine (NORVASC) 5 MG tablet Take 1 tablet (5 mg total) by mouth daily. 01/17/17   Elson Areas, PA-C  Artificial Saliva [facility]) LOZG Use as directed 1 lozenge in the mouth or throat 3 times/day as needed-between meals & bedtime. 11/11/17   Mardella Layman, MD  cetirizine (ZYRTEC ALLERGY) 10 MG tablet Take 1 tablet (10 mg total) by mouth daily. 01/14/18   Wallis Bamberg, PA-C  docusate sodium (COLACE) 100 MG capsule Take 100 mg by mouth 2 (two) times daily.    [provider]  linaclotide Karlene Einstein) 72 MCG capsule Take 1 capsule (72 mcg total) by mouth daily before breakfast. 09/10/17 12/09/17  Lucia Estelle, NP  lisinopril (ZESTRIL) 10 MG tablet Take 10 mg by mouth daily.    [provider]  metFORMIN (GLUCOPHAGE XR) 500 MG 24 hr tablet Take 1 tablet (500 mg total) by mouth daily with breakfast. 12/28/17   Elvina Sidle, MD  tamsulosin (FLOMAX) 0.4 MG CAPS capsule Take 1 capsule (0.4 mg total) by mouth daily after supper. 01/17/17  Cheron Schaumann K, PA-C  traMADol (ULTRAM) 50 MG tablet Take 1 tablet (50 mg total) by mouth every 6 (six) hours as needed. 10/18/17   Mardella Layman, MD     Allergies Pork-derived products   History reviewed. No pertinent family history.  Social History Social History   Tobacco Use   Smoking status: Never   Smokeless tobacco: Never  Substance Use Topics   Alcohol use: No   Drug use: No    Review of Systems Level 5 Caveat: Portions of the History and Physical including HPI and review of systems are unable to be completely  obtained due to patient being a poor historian   Constitutional:   No known fever.  ENT:   No rhinorrhea. Cardiovascular:   No chest pain or syncope. Respiratory:   No dyspnea or cough. Gastrointestinal:   Negative for abdominal pain, vomiting and diarrhea.  Musculoskeletal:   Negative for focal pain or swelling ____________________________________________   PHYSICAL EXAM:  VITAL SIGNS: ED Triage Vitals  Enc Vitals Group     BP 02/20/21 1832 119/84     Pulse Rate 02/20/21 1832 96     Resp 02/20/21 1832 16     Temp 02/20/21 1832 97.6 F (36.4 C)     Temp Source 02/20/21 1832 Axillary     SpO2 02/20/21 1832 100 %     Weight --      Height --      Head Circumference --      Peak Flow --      Pain Score 02/20/21 1833 0     Pain Loc --      Pain Edu? --      Excl. in GC? --     Vital signs reviewed, nursing assessments reviewed.   Constitutional:   Awake and alert.  Not oriented.  Non-toxic appearance. Eyes:   Conjunctivae are normal. EOMI. PERRL. ENT      Head:   Normocephalic and atraumatic.      Nose:   No congestion/rhinnorhea.       Mouth/Throat:   Dry mucous membranes, no pharyngeal erythema. No peritonsillar mass.       Neck:   No meningismus. Full ROM. Hematological/Lymphatic/Immunilogical:   No cervical lymphadenopathy. Cardiovascular:   RRR. Symmetric bilateral radial and DP pulses.  No murmurs. Cap refill less than 2 seconds. Respiratory:   Normal respiratory effort without tachypnea/retractions. Breath sounds are clear and equal bilaterally. No wheezes/rales/rhonchi. Gastrointestinal:   Soft and nontender. Non distended.  There is a large ventral hernia which is nontender, not firm..  No rebound, rigidity, or guarding. Genitourinary:   deferred Musculoskeletal:   Normal range of motion in all extremities. No joint effusions.  No lower extremity tenderness.  No edema. Neurologic:   Normal speech, incomprehensible language expression with interpreter assistance,  likely baseline Motor grossly intact. No acute focal neurologic deficits are appreciated.  Skin:    Skin is warm, dry and intact. No rash noted.  No petechiae, purpura, or bullae.  ____________________________________________    LABS (pertinent positives/negatives) (all labs ordered are listed, but only abnormal results are displayed) Labs Reviewed  COMPREHENSIVE METABOLIC PANEL - Abnormal; Notable for the following components:      Result Value   Sodium 148 (*)    Potassium 5.4 (*)    Chloride 117 (*)    Glucose, Bld 101 (*)    BUN 46 (*)    Creatinine, Ser 1.74 (*)    Calcium 11.3 (*)  Total Protein 8.2 (*)    Albumin 3.1 (*)    AST 44 (*)    GFR, Estimated 39 (*)    All other components within normal limits  LIPASE, BLOOD - Abnormal; Notable for the following components:   Lipase 77 (*)    All other components within normal limits  CBC WITH DIFFERENTIAL/PLATELET - Abnormal; Notable for the following components:   WBC 16.3 (*)    RBC 4.02 (*)    Hemoglobin 11.0 (*)    HCT 36.8 (*)    MCHC 29.9 (*)    RDW 17.6 (*)    Platelets 513 (*)    Neutro Abs 12.6 (*)    All other components within normal limits  URINALYSIS, COMPLETE (UACMP) WITH MICROSCOPIC - Abnormal; Notable for the following components:   Color, Urine YELLOW (*)    APPearance CLOUDY (*)    Hgb urine dipstick LARGE (*)    Ketones, ur 5 (*)    Protein, ur 100 (*)    Leukocytes,Ua LARGE (*)    RBC / HPF >50 (*)    WBC, UA >50 (*)    All other components within normal limits  RESP PANEL BY RT-PCR (FLU A&B, COVID) ARPGX2  URINE CULTURE   ____________________________________________   EKG  Interpreted by me Sinus rhythm rate of 96, normal axis and intervals.  Normal QRS ST segments and T waves.  No ischemic changes or evidence of underlying arrhythmia.  ____________________________________________    RADIOLOGY  DG Knee 2 Views Right  Result Date: 02/20/2021 CLINICAL DATA:  80 year old male with  fall and trauma to the right knee. EXAM: RIGHT KNEE - 1-2 VIEW COMPARISON:  None. FINDINGS: There is no acute fracture or dislocation. Mild osteopenia and arthritic changes. No joint effusion. The soft tissues are unremarkable. IMPRESSION: No acute fracture or dislocation. Electronically Signed   By: Elgie CollardArash  Radparvar M.D.   On: 02/20/2021 19:59   DG Abdomen 1 View  Result Date: 02/20/2021 CLINICAL DATA:  80 year old male with fall. EXAM: ABDOMEN - 1 VIEW COMPARISON:  Abdominal radiograph dated 08/26/2017. FINDINGS: Evaluation is limited due to suboptimal positioning. There is no bowel dilatation or evidence of obstruction. No free air. Similar appearance of a 3 mm radiopaque focus over the left pelvic brim, likely vascular calcification. No acute osseous pathology. IMPRESSION: No acute findings. Electronically Signed   By: Elgie CollardArash  Radparvar M.D.   On: 02/20/2021 19:55   CT Head Wo Contrast  Result Date: 02/20/2021 CLINICAL DATA:  Unwitnessed fall. EXAM: CT HEAD WITHOUT CONTRAST TECHNIQUE: Contiguous axial images were obtained from the base of the skull through the vertex without intravenous contrast. COMPARISON:  08/05/2017 FINDINGS: Brain: Moderately enlarged ventricles and subarachnoid spaces. Mild patchy white matter low density in both cerebral hemispheres. Interval old right posterior basal ganglia lacunar infarct. No intracranial hemorrhage, mass lesion or CT evidence of acute infarction. Vascular: No hyperdense vessel or unexpected calcification. Skull: Normal. Negative for fracture or focal lesion. Sinuses/Orbits: Unremarkable. Other: None. IMPRESSION: 1. No acute abnormality. 2. Moderate diffuse cerebral and cerebellar atrophy progression. 3. Interval mild chronic small vessel white matter ischemic changes in both cerebral hemispheres. 4. Interval old right basal ganglia lacunar infarct. Electronically Signed   By: Beckie SaltsSteven  Reid M.D.   On: 02/20/2021 20:42   DG Chest Portable 1 View  Result Date:  02/20/2021 CLINICAL DATA:  Recent fall EXAM: PORTABLE CHEST 1 VIEW COMPARISON:  12/01/2016 FINDINGS: Cardiac shadow is within normal limits. Aortic calcifications are noted. The lungs are  clear bilaterally. No acute bony abnormality is seen. IMPRESSION: No active disease. Electronically Signed   By: Alcide Clever M.D.   On: 02/20/2021 19:59    ____________________________________________   PROCEDURES Procedures  ____________________________________________  DIFFERENTIAL DIAGNOSIS   Dehydration, electrolyte abnormality, UTI, pneumonia, viral illness, intracranial hemorrhage, knee fracture, bowel obstruction  CLINICAL IMPRESSION / ASSESSMENT AND PLAN / ED COURSE  Medications ordered in the ED: Medications  cephALEXin (KEFLEX) capsule 500 mg (has no administration in time range)  sodium chloride 0.9 % bolus 1,000 mL (1,000 mLs Intravenous New Bag/Given 02/20/21 1853)  lactated ringers bolus 1,000 mL (1,000 mLs Intravenous New Bag/Given 02/20/21 2058)    Pertinent labs & imaging results that were available during my care of the patient were reviewed by me and considered in my medical decision making (see chart for details).   Daniel Porter was evaluated in Emergency Department on 02/20/2021 for the symptoms described in the history of present illness. He was evaluated in the context of the global COVID-19 pandemic, which necessitated consideration that the patient might be at risk for infection with the SARS-CoV-2 virus that causes COVID-19. Institutional protocols and algorithms that pertain to the evaluation of patients at risk for COVID-19 are in a state of rapid change based on information released by regulatory bodies including the CDC and federal and state organizations. These policies and algorithms were followed during the patient's care in the ED.   Patient with advanced dementia arrives after unwitnessed fall.  Unable to get any reliable history related to positive or negative symptoms.   Exam is nonfocal, no serious trauma.  He does appear to be dehydrated.  Will give IV fluids while checking labs, urinalysis, x-rays, CT head.  Clinical Course as of 02/20/21 2212  Sat Feb 20, 2021  2046 CT head and x-rays are all unremarkable. [PS]    Clinical Course User Index [PS] Sharman Cheek, MD    ----------------------------------------- 10:12 PM on 02/20/2021 ----------------------------------------- Urinalysis shows evidence of UTI.  Will treat with Keflex twice daily for cystitis.  Doubt pyelonephritis, not septic.  Doubt obstructing stone.  Will bladder scan and plan to discharge back to care facility.   ____________________________________________   FINAL CLINICAL IMPRESSION(S) / ED DIAGNOSES    Final diagnoses:  Unwitnessed fall  Advanced dementia (HCC)  Type 2 diabetes mellitus without complication, without long-term current use of insulin (HCC)  Dehydration  Cystitis     ED Discharge Orders          Ordered    cephALEXin (KEFLEX) 500 MG capsule  2 times daily        02/20/21 2211            Portions of this note were generated with dragon dictation software. Dictation errors may occur despite best attempts at proofreading.   Sharman Cheek, MD 02/20/21 2212

## 2021-02-20 NOTE — ED Triage Notes (Signed)
BIB EMS from [facility]. Unwitnessed fall. Contusion to right knee. Language barrier. HX of DM. BGL 76.

## 2021-02-22 LAB — URINE CULTURE: Culture: NO GROWTH

## 2021-03-15 DEATH — deceased

## 2021-05-27 ENCOUNTER — Ambulatory Visit: Payer: Medicare Other | Admitting: Urology

## 2021-05-28 ENCOUNTER — Encounter: Payer: Self-pay | Admitting: Urology

## 2021-12-29 IMAGING — DX DG KNEE 1-2V*R*
2 series · 2 of 2 positions shown · non-contrast
Comparison: None.

CLINICAL DATA: 80-year-old male with fall and trauma to the right
knee.

EXAM:
RIGHT KNEE - 1-2 VIEW

[knee ap]
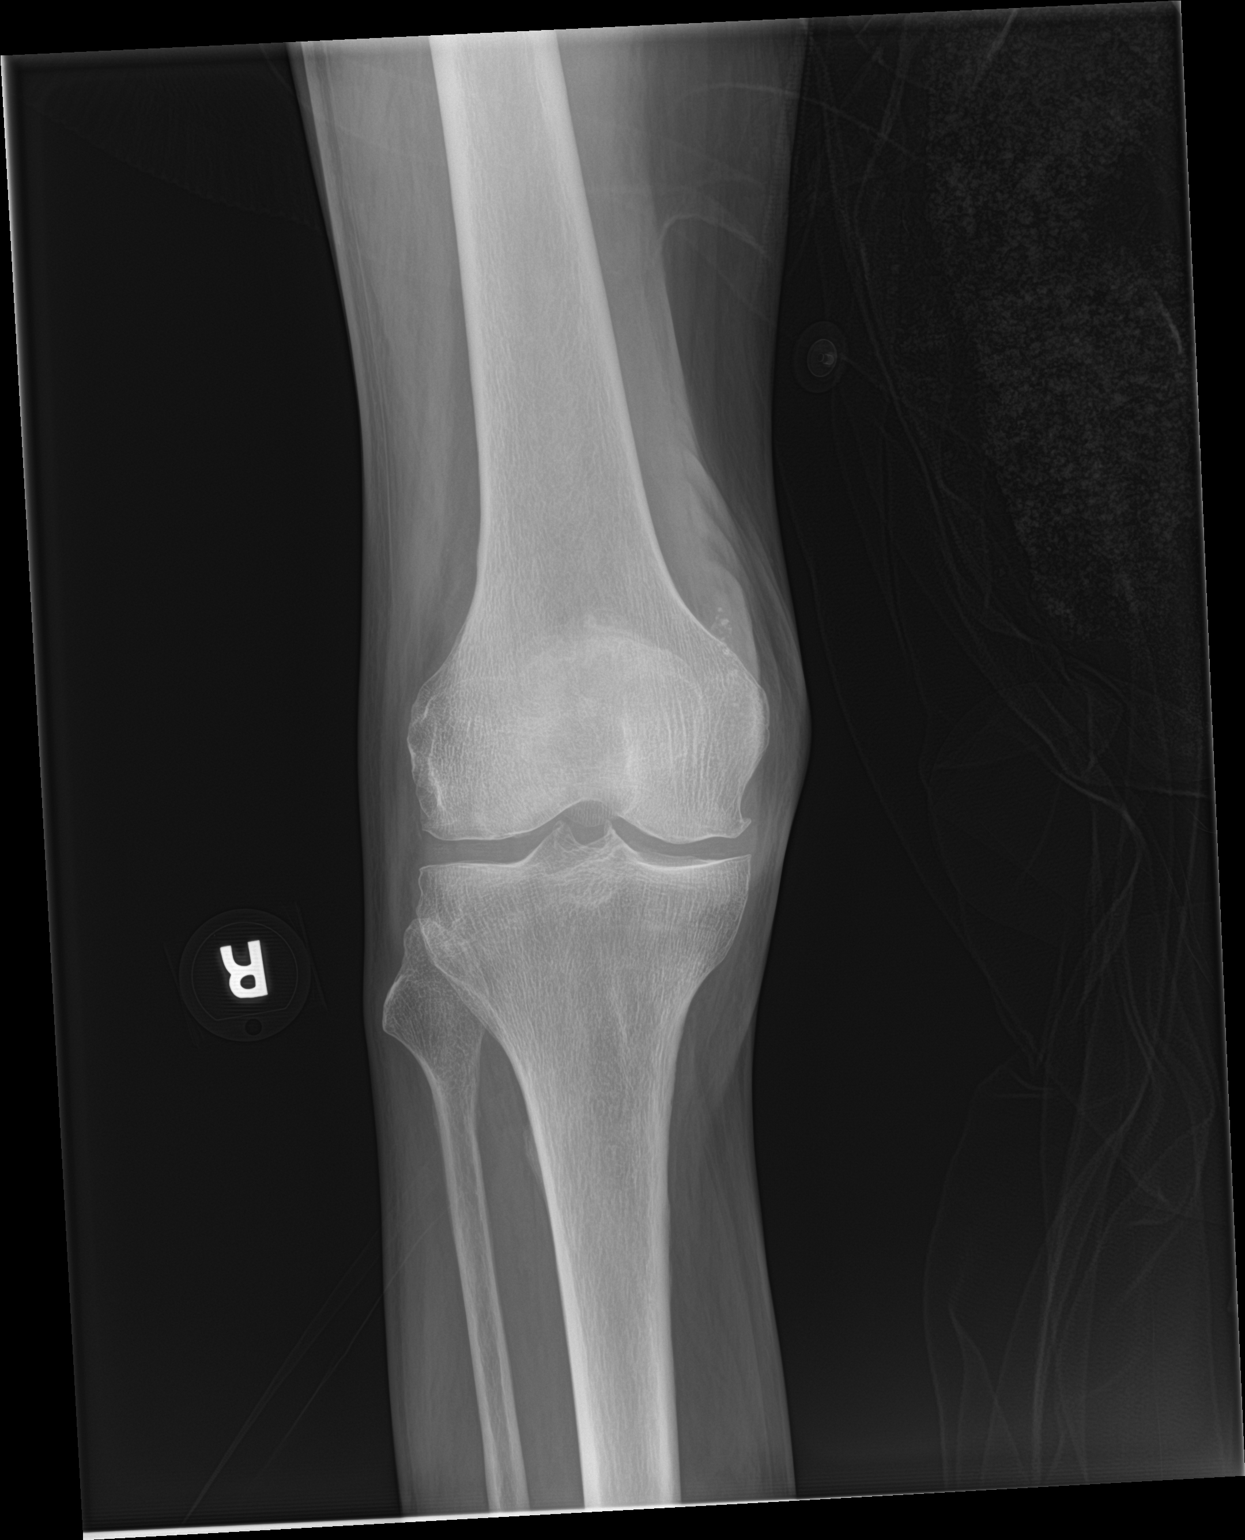

[knee lat]
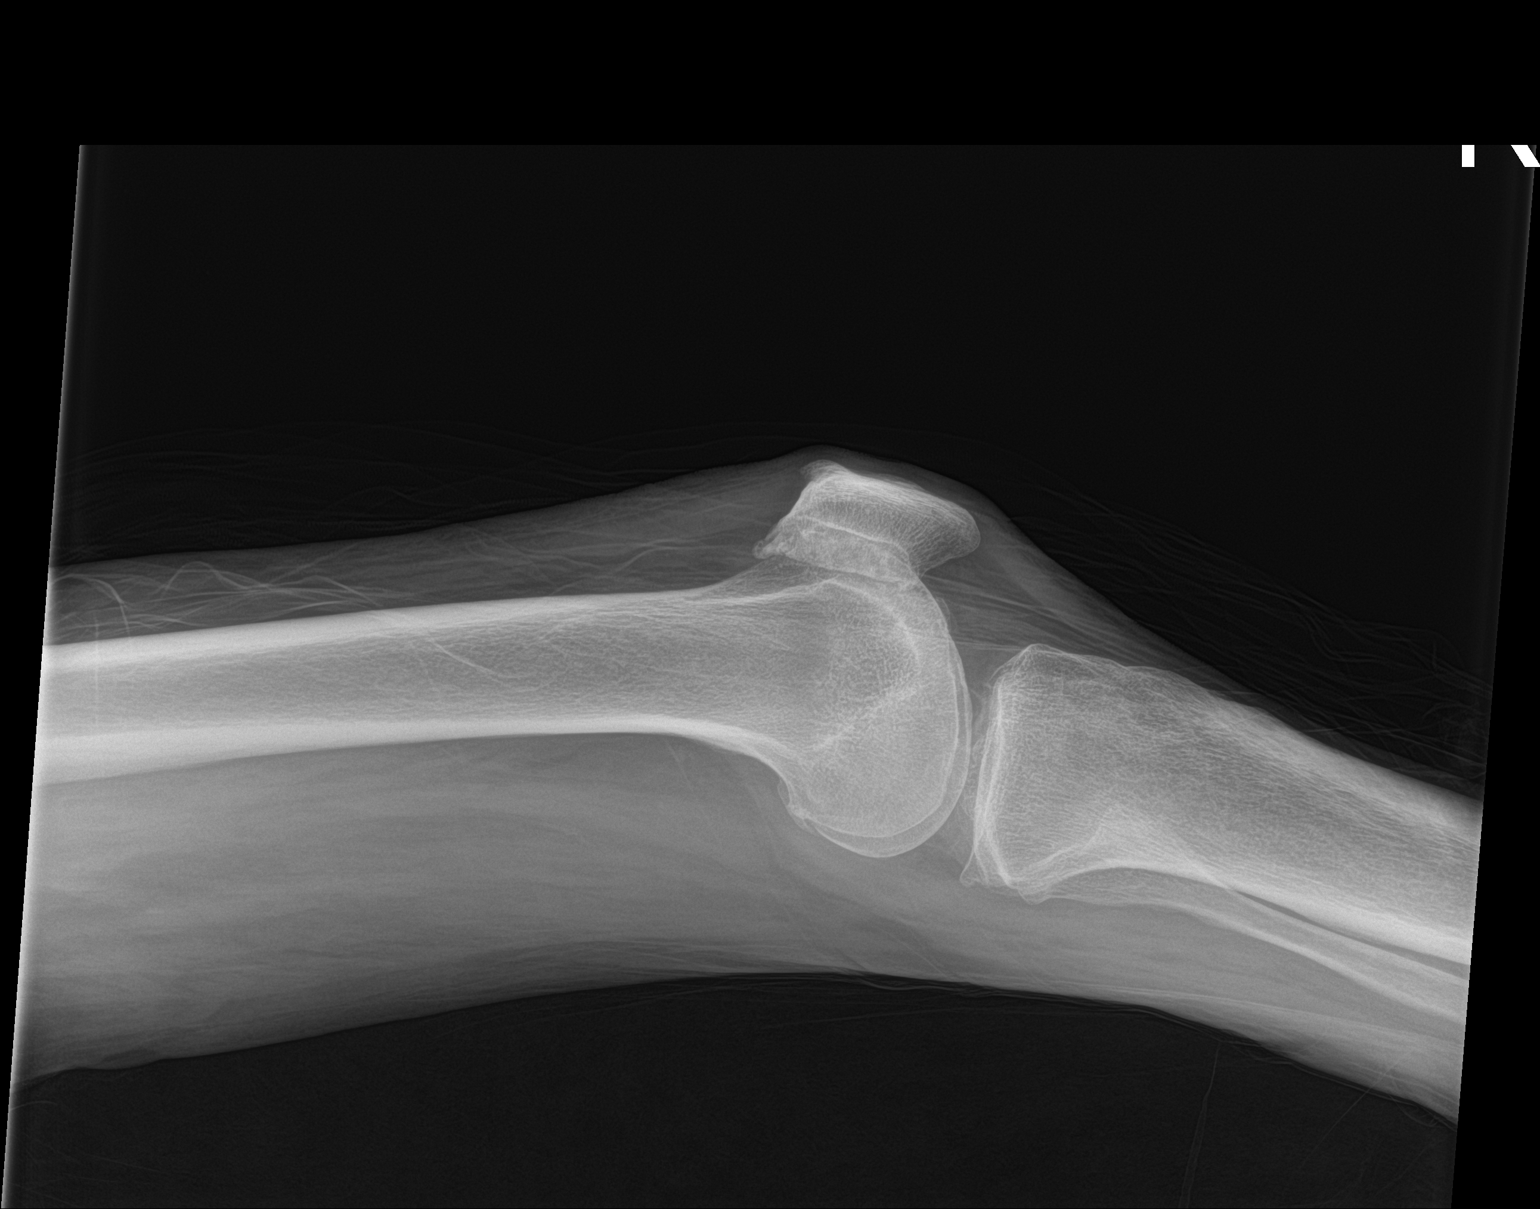

[2 of 2 positions shown; findings below may reference images not displayed]

FINDINGS: There is no acute fracture or dislocation. Mild osteopenia and
arthritic changes. No joint effusion. The soft tissues are
unremarkable.
IMPRESSION: No acute fracture or dislocation.
